# Patient Record
Sex: Female | Born: 1970 | Race: Black or African American | Hispanic: No | Marital: Single
Health system: Southern US, Community
[De-identification: ages and names within clinical notes are randomized; demographics above are authoritative.]

## PROBLEM LIST (undated history)

## (undated) DIAGNOSIS — N912 Amenorrhea, unspecified: Secondary | ICD-10-CM

## (undated) DIAGNOSIS — E669 Obesity, unspecified: Secondary | ICD-10-CM

## (undated) DIAGNOSIS — K219 Gastro-esophageal reflux disease without esophagitis: Secondary | ICD-10-CM

## (undated) DIAGNOSIS — Z1159 Encounter for screening for other viral diseases: Secondary | ICD-10-CM

## (undated) DIAGNOSIS — Z789 Other specified health status: Secondary | ICD-10-CM

## (undated) DIAGNOSIS — E039 Hypothyroidism, unspecified: Secondary | ICD-10-CM

## (undated) DIAGNOSIS — A599 Trichomoniasis, unspecified: Secondary | ICD-10-CM

## (undated) DIAGNOSIS — J309 Allergic rhinitis, unspecified: Secondary | ICD-10-CM

## (undated) DIAGNOSIS — R05 Cough: Secondary | ICD-10-CM

## (undated) DIAGNOSIS — R7301 Impaired fasting glucose: Secondary | ICD-10-CM

## (undated) DIAGNOSIS — R053 Chronic cough: Secondary | ICD-10-CM

## (undated) HISTORY — PX: TUBAL LIGATION: SHX77

## (undated) HISTORY — DX: Amenorrhea, unspecified: N91.2

## (undated) HISTORY — DX: Trichomoniasis, unspecified: A59.9

## (undated) HISTORY — DX: Other specified health status: Z78.9

## (undated) HISTORY — DX: Chronic cough: R05.3

## (undated) HISTORY — DX: Cough: R05

## (undated) HISTORY — DX: Allergic rhinitis, unspecified: J30.9

## (undated) HISTORY — DX: Gastro-esophageal reflux disease without esophagitis: K21.9

## (undated) HISTORY — PX: ENDOMETRIAL ABLATION: SHX621

## (undated) HISTORY — DX: Impaired fasting glucose: R73.01

## (undated) HISTORY — DX: Hypothyroidism, unspecified: E03.9

## (undated) HISTORY — DX: Obesity, unspecified: E66.9

## (undated) HISTORY — DX: Encounter for screening for other viral diseases: Z11.59

---

## 2011-03-29 ENCOUNTER — Encounter: Payer: Self-pay | Admitting: Family Medicine

## 2011-03-29 ENCOUNTER — Ambulatory Visit (INDEPENDENT_AMBULATORY_CARE_PROVIDER_SITE_OTHER): Payer: BC Managed Care – PPO | Admitting: Family Medicine

## 2011-03-29 VITALS — BP 110/70 | HR 97 | Ht 64.0 in | Wt 184.0 lb

## 2011-03-29 DIAGNOSIS — R05 Cough: Secondary | ICD-10-CM

## 2011-03-29 DIAGNOSIS — K219 Gastro-esophageal reflux disease without esophagitis: Secondary | ICD-10-CM

## 2011-03-29 MED ORDER — ALBUTEROL SULFATE HFA 108 (90 BASE) MCG/ACT IN AERS: 2.0000 | INHALATION_SPRAY | Freq: Four times a day (QID) | RESPIRATORY_TRACT | Status: DC | PRN

## 2011-03-29 NOTE — Progress Notes (Signed)
  Subjective:    Patient ID: Gail Bartlett, female    DOB: Apr 29, 1970, 41 y.o.   MRN: [id number]  HPI She is here for evaluation of a one year history of cough she has been seen in the past for this and apparently had a chest x-ray which was negative. She was placed on Nexium which did not help. No sneezing, wheezing,itchy watery eyes or runny nose. She can cough at any time. Sometimes foods do it. She does notices a little more at work. She also complains of acid the feeling but cannot relate this to coughing. She states that the Nexium even with double dose and did not help with this symptom.   Review of Systems     Objective:   Physical Exam alert and in no distress. Tympanic membranes and canals are normal. Throat is clear. Tonsils are normal. Neck is supple without adenopathy or thyromegaly. Cardiac exam shows a regular sinus rhythm without murmurs or gallops. Lungs are clear to auscultation.        Assessment & Plan:  Chronic cough, GERD  etiology unclear. I will initially treat her with Dexilant to see if this will help with her cough. If this does not help, I did call in a prescription for albuterol. She is to let me know how this works. I explained to her that cough can come from multiple causes and sometimes be very difficult to figure out.

## 2011-03-29 NOTE — Patient Instructions (Signed)
Take the Dexilant daily for the next 5 days and let me know how the medication works. If it does not work then pick up the prescription that I will call them for you

## 2011-04-06 DIAGNOSIS — A599 Trichomoniasis, unspecified: Secondary | ICD-10-CM

## 2011-04-06 HISTORY — DX: Trichomoniasis, unspecified: A59.9

## 2011-04-30 ENCOUNTER — Ambulatory Visit (INDEPENDENT_AMBULATORY_CARE_PROVIDER_SITE_OTHER): Payer: BC Managed Care – PPO | Admitting: Family Medicine

## 2011-04-30 ENCOUNTER — Encounter: Payer: Self-pay | Admitting: Family Medicine

## 2011-04-30 ENCOUNTER — Other Ambulatory Visit (HOSPITAL_COMMUNITY)
Admission: RE | Admit: 2011-04-30 | Discharge: 2011-04-30 | Disposition: A | Discharge status: Home / Self Care | Payer: BC Managed Care – PPO | Source: Ambulatory Visit | Attending: Family Medicine | Admitting: Family Medicine

## 2011-04-30 VITALS — BP 110/76 | HR 80 | Ht 64.25 in | Wt 183.0 lb

## 2011-04-30 DIAGNOSIS — R059 Cough, unspecified: Secondary | ICD-10-CM

## 2011-04-30 DIAGNOSIS — R05 Cough: Secondary | ICD-10-CM

## 2011-04-30 DIAGNOSIS — R5381 Other malaise: Secondary | ICD-10-CM

## 2011-04-30 DIAGNOSIS — E039 Hypothyroidism, unspecified: Secondary | ICD-10-CM

## 2011-04-30 DIAGNOSIS — R7301 Impaired fasting glucose: Secondary | ICD-10-CM

## 2011-04-30 DIAGNOSIS — Z13220 Encounter for screening for lipoid disorders: Secondary | ICD-10-CM

## 2011-04-30 DIAGNOSIS — R32 Unspecified urinary incontinence: Secondary | ICD-10-CM

## 2011-04-30 DIAGNOSIS — Z0000 Encounter for general adult medical examination without abnormal findings: Secondary | ICD-10-CM

## 2011-04-30 DIAGNOSIS — Z23 Encounter for immunization: Secondary | ICD-10-CM

## 2011-04-30 DIAGNOSIS — Z01419 Encounter for gynecological examination (general) (routine) without abnormal findings: Secondary | ICD-10-CM | POA: Insufficient documentation

## 2011-04-30 DIAGNOSIS — Z202 Contact with and (suspected) exposure to infections with a predominantly sexual mode of transmission: Secondary | ICD-10-CM

## 2011-04-30 DIAGNOSIS — Z113 Encounter for screening for infections with a predominantly sexual mode of transmission: Secondary | ICD-10-CM | POA: Insufficient documentation

## 2011-04-30 LAB — COMPREHENSIVE METABOLIC PANEL
ALT: 13 U/L (ref 0–35)
CO2: 25 mEq/L (ref 19–32)
Creat: 1.02 mg/dL (ref 0.50–1.10)
Total Bilirubin: 0.3 mg/dL (ref 0.3–1.2)

## 2011-04-30 LAB — POCT URINALYSIS DIPSTICK
Bilirubin, UA: NEGATIVE
Blood, UA: NEGATIVE
Glucose, UA: NEGATIVE
Ketones, UA: NEGATIVE
Spec Grav, UA: 1.01

## 2011-04-30 LAB — LIPID PANEL
HDL: 58 mg/dL (ref >39)
LDL Cholesterol: 72 mg/dL (ref 0–99)
Total CHOL/HDL Ratio: 2.4 Ratio
Triglycerides: 46 mg/dL (ref <150)
VLDL: 9 mg/dL (ref 0–40)

## 2011-04-30 LAB — HIV ANTIBODY (ROUTINE TESTING W REFLEX): HIV: NONREACTIVE

## 2011-04-30 LAB — CBC WITH DIFFERENTIAL/PLATELET
Basophils Absolute: 0 10*3/uL (ref 0.0–0.1)
Eosinophils Relative: 2 % (ref 0–5)
HCT: 39.2 % (ref 36.0–46.0)
Lymphocytes Relative: 20 % (ref 12–46)
Lymphs Abs: 2.2 10*3/uL (ref 0.7–4.0)
MCV: 84.1 fL (ref 78.0–100.0)
Monocytes Absolute: 0.4 10*3/uL (ref 0.1–1.0)
RDW: 14.7 % (ref 11.5–15.5)
WBC: 11.1 10*3/uL — ABNORMAL HIGH (ref 4.0–10.5)

## 2011-04-30 NOTE — Progress Notes (Signed)
Gail Bartlett is a 41 y.o. female who presents for a complete physical.  She has the following concerns:  F/u on cough.  Took Dexilant samples, which didn't seem to help much.  Seems to cough a lot at work (?related to scents?), never tried the inhaler that Dr. Susann Bartlett rx'd. She has a lot of throat clearing, but denies runny nose or sneezing.  Cough has overall gotten a whole lot better, but still periodically has spasms of coughing, feels like there is phlegm in her throat.  Hypothyroidism--ran out of her medications for 4 days, started back on meds again last week.  Felt very fatigued while out of meds, and hasn't gotten back to baseline yet.  Health Maintenance:  There is no immunization history on file for this patient. Doesn't recall last tetanus shot, doesn't think she needed it for her job.  Has gotten TB tests and Hepatitis B vaccines. Refuses flu shots Last Pap smear: 2 years ago; 1 abnormal pap with normal repeat, no treatment needed Last mammogram: 2 years ago Last colonoscopy: never Last DEXA: never Dentist: 3 years ago Ophtho: never Exercise: nothing outside of work  Past Medical History  Diagnosis Date  . Hypothyroidism age 43  . Impaired fasting glucose     when at 200 pounds  . GERD (gastroesophageal reflux disease)     Past Surgical History  Procedure Date  . Tubal ligation   . Endometrial ablation 2006 or 2007  . Cesarean section     History   Social History  . Marital Status: Single    Spouse Name: N/A    Number of Children: 2  . Years of Education: N/A   Occupational History  . CNA [employer]   Social History Main Topics  . Smoking status: Never Smoker   . Smokeless tobacco: Never Used  . Alcohol Use: No  . Drug Use: No  . Sexually Active: Not Currently   Other Topics Concern  . Not on file   Social History Narrative   Lives with son.  Daughter lives in [place], [place]  (has 1 grandson). No pets    Family History  Problem  Relation Age of Onset  . Hypertension Mother   . Anemia Mother     needed blood tranfusions  . Cancer Father     brain  . Schizophrenia Father     paranoid  . Gout Father   . Diabetes Brother   . ADD / ADHD Daughter   . ADD / ADHD Son   . Cancer Maternal Grandmother     lung cancer    Current outpatient prescriptions:levothyroxine (SYNTHROID) 100 MCG tablet, Take 100 mcg by mouth daily., Disp: , Rfl: ;  albuterol (PROVENTIL HFA;VENTOLIN HFA) 108 (90 BASE) MCG/ACT inhaler, Inhale 2 puffs into the lungs every 6 (six) hours as needed for wheezing., Disp: 1 Inhaler, Rfl: 0  No Known Allergies  ROS:  The patient denies anorexia, fever, weight changes, headaches,  vision changes, decreased hearing, ear pain, sore throat, breast concerns, chest pain, palpitations, dizziness, syncope, dyspnea on exertion, swelling, nausea, vomiting, diarrhea, constipation, abdominal pain, melena, hematochezia, indigestion/heartburn, hematuria, dysuria, vaginal discharge, odor or itch, genital lesions, joint pains, numbness, tingling, weakness, tremor, suspicious skin lesions, depression, anxiety, abnormal bleeding/bruising, or enlarged lymph nodes. +gassiness (possibly from lactose intolerance).  +headaches (frontal). Phlegm is clear when she is able to expectorate some.  Leakage of urine with coughing spells., otherwise no urinary symptoms, occasional knee pain. No vaginal bleeding since  ablation. Occasional sciatic nerve pain R leg  PHYSICAL EXAM: BP 110/76  Pulse 80  Ht 5' 4.25" (1.632 m)  Wt 183 lb (83.008 kg)  BMI 31.17 kg/m2  General Appearance:    Alert, cooperative, no distress, appears stated age.  Occasional cough.  Head:    Normocephalic, without obvious abnormality, atraumatic  Eyes:    PERRL, conjunctiva/corneas clear, EOM's intact, fundi    benign  Ears:    Normal TM's and external ear canals  Nose:   Nares normal, mucosa normal, no drainage or sinus tenderness. Nasal mucosa mildly edematous,  pale  Throat:   Lips, mucosa, and tongue normal; teeth and gums normal  Neck:   Supple, no lymphadenopathy;  thyroid:  no   enlargement/tenderness/nodules; no carotid   bruit or JVD  Back:    Spine nontender, no curvature, ROM normal, no CVA     tenderness  Lungs:     Clear to auscultation bilaterally without wheezes, rales or     ronchi; respirations unlabored  Chest Wall:    No tenderness or deformity   Heart:    Regular rate and rhythm, S1 and S2 normal, no murmur, rub   or gallop  Breast Exam:    No tenderness, masses, or nipple discharge or inversion.      No axillary lymphadenopathy.  Diffuse fibrocystic changes bilaterally, tender in UOQ  Bilaterally without discrete mass  Abdomen:     Soft, non-tender, nondistended, normoactive bowel sounds,    no masses, no hepatosplenomegaly  Genitalia:    Normal external genitalia without lesions.  BUS and vagina normal; cervix without lesions, or cervical motion tenderness. No abnormal vaginal discharge.  Uterus and adnexa not enlarged, nontender, no masses.  Pap performed  Rectal:    Normal tone, no masses or tenderness; guaiac negative stool  Extremities:   No clubbing, cyanosis or edema  Pulses:   2+ and symmetric all extremities  Skin:   Skin color, texture, turgor normal, no rashes or lesions  Lymph nodes:   Cervical, supraclavicular, and axillary nodes normal  Neurologic:   CNII-XII intact, normal strength, sensation and gait; reflexes 2+ and symmetric throughout          Psych:   Normal mood, affect, hygiene and grooming.      ASSESSMENT/PLAN: 1. Routine general medical examination at a health care facility  POCT Urinalysis Dipstick, Visual acuity screening, Cytology - PAP, HIV Antibody  2. Unspecified hypothyroidism  TSH  3. Other malaise and fatigue  Comprehensive metabolic panel, CBC with Differential, Vitamin D 25 hydroxy, TSH  4. Exposure to STD  Cytology - PAP, HIV Antibody  5. Impaired fasting glucose  Hemoglobin A1c  6.  Screening for lipoid disorders  Lipid panel  7. Need for Tdap vaccination  Tdap vaccine greater than or equal to 7yo IM  8. Incontinence  Urine Culture  9. Cough     Cough--not improved with treatment for reflux.  Likely has allergy component too--recommended trial of OTC Claritin or Zyrtec. Trial of inhaler as needed at work.  Possible lactose intolerance as cause for gassiness.  Reviewed lactose-free diet.  Hypothyroidism--missed only 4 doses, still fatigued and back on meds x 5 days.  If TSH borderline, plan to continue current dose and repeat in 3 months.  If very high, then adjust dose and recheck in 6 weeks.  Discussed monthly self breast exams and yearly mammograms after the age of 13; at least 30 minutes of aerobic activity at least 5 days/week;  proper sunscreen use reviewed; healthy diet, including goals of calcium and vitamin D intake and alcohol recommendations (less than or equal to 1 drink/day) reviewed; regular seatbelt use; changing batteries in smoke detectors.  Immunization recommendations discussed.  Colonoscopy recommendations reviewed  TdaP today. Yearly flu shots encouraged.  Dentist recommended every 6 months. Schedule routine eye exam (if all okay, every 3-5 years)

## 2011-04-30 NOTE — Patient Instructions (Signed)
HEALTH MAINTENANCE RECOMMENDATIONS:  It is recommended that you get at least 30 minutes of aerobic exercise at least 5 days/week (for weight loss, you may need as much as 60-90 minutes). This can be any activity that gets your heart rate up. This can be divided in 10-15 minute intervals if needed, but try and build up your endurance at least once a week.  Weight bearing exercise is also recommended twice weekly.  Eat a healthy diet with lots of vegetables, fruits and fiber.  "Colorful" foods have a lot of vitamins (ie green vegetables, tomatoes, red peppers, etc).  Limit sweet tea, regular sodas and alcoholic beverages, all of which has a lot of calories and sugar.  Up to 1 alcoholic drink daily may be beneficial for women (unless trying to lose weight, watch sugars).  Drink a lot of water.  Calcium recommendations are 1200-1500 mg daily (1500 mg for postmenopausal women or women without ovaries), and vitamin D 1000 IU daily.  This should be obtained from diet and/or supplements (vitamins), and calcium should not be taken all at once, but in divided doses.  Monthly self breast exams and yearly mammograms for women over the age of 83 is recommended.  Sunscreen of at least SPF 30 should be used on all sun-exposed parts of the skin when outside between the hours of 10 am and 4 pm (not just when at beach or pool, but even with exercise, golf, tennis, and yard work!)  Use a sunscreen that says "broad spectrum" so it covers both UVA and UVB rays, and make sure to reapply every 1-2 hours.  Remember to change the batteries in your smoke detectors when changing your clock times in the spring and fall.  Try taking Claritin or Zyrtec daily, to see if this helps with your cough.  If coughing spasm at work, try using the inhaler. Use your seat belt every time you are in a car, and please drive safely and not be distracted with cell phones and texting while driving.  Schedule routine dental visits (cleanings  every 6 months) Schedule routine eye exam (every 3-5 years)

## 2011-05-01 DIAGNOSIS — E039 Hypothyroidism, unspecified: Secondary | ICD-10-CM | POA: Insufficient documentation

## 2011-05-01 LAB — HEMOGLOBIN A1C: Mean Plasma Glucose: 126 mg/dL — ABNORMAL HIGH (ref <117)

## 2011-05-02 ENCOUNTER — Encounter: Payer: Self-pay | Admitting: Family Medicine

## 2011-05-02 ENCOUNTER — Other Ambulatory Visit: Payer: Self-pay | Admitting: *Deleted

## 2011-05-02 MED ORDER — ERGOCALCIFEROL 1.25 MG (50000 UT) PO CAPS: 50000.0000 [IU] | ORAL_CAPSULE | ORAL | Status: DC

## 2011-05-03 ENCOUNTER — Telehealth: Payer: Self-pay | Admitting: *Deleted

## 2011-05-03 ENCOUNTER — Other Ambulatory Visit: Payer: Self-pay | Admitting: *Deleted

## 2011-05-03 DIAGNOSIS — A599 Trichomoniasis, unspecified: Secondary | ICD-10-CM

## 2011-05-03 MED ORDER — METRONIDAZOLE 500 MG PO TABS: 2000.0000 mg | ORAL_TABLET | Freq: Once | ORAL | Status: DC

## 2011-05-03 NOTE — Telephone Encounter (Signed)
Error

## 2011-05-04 LAB — URINE CULTURE

## 2011-05-07 ENCOUNTER — Other Ambulatory Visit: Payer: Self-pay | Admitting: *Deleted

## 2011-05-07 DIAGNOSIS — N390 Urinary tract infection, site not specified: Secondary | ICD-10-CM

## 2011-05-07 MED ORDER — SULFAMETHOXAZOLE-TMP DS 800-160 MG PO TABS: 1.0000 | ORAL_TABLET | Freq: Two times a day (BID) | ORAL | Status: DC

## 2011-06-25 ENCOUNTER — Other Ambulatory Visit: Payer: Self-pay | Admitting: Family Medicine

## 2011-06-25 DIAGNOSIS — E039 Hypothyroidism, unspecified: Secondary | ICD-10-CM

## 2011-06-25 MED ORDER — SYNTHROID 100 MCG PO TABS: 100.0000 ug | ORAL_TABLET | Freq: Every day | ORAL | Status: DC

## 2011-06-25 NOTE — Telephone Encounter (Signed)
done

## 2011-06-25 NOTE — Telephone Encounter (Signed)
PT NEEDS BRAND NAME SYNTHROID, GET HA'S WITH GENERICS

## 2011-09-05 ENCOUNTER — Encounter: Payer: Self-pay | Admitting: Family Medicine

## 2011-09-05 ENCOUNTER — Ambulatory Visit (INDEPENDENT_AMBULATORY_CARE_PROVIDER_SITE_OTHER): Payer: Medicaid Other | Admitting: Family Medicine

## 2011-09-05 ENCOUNTER — Ambulatory Visit
Admission: RE | Admit: 2011-09-05 | Discharge: 2011-09-05 | Disposition: A | Discharge status: Home / Self Care | Payer: Medicaid Other | Source: Ambulatory Visit | Attending: Family Medicine | Admitting: Family Medicine

## 2011-09-05 VITALS — BP 112/72 | HR 76 | Temp 97.6°F | Ht 64.25 in | Wt 185.0 lb

## 2011-09-05 DIAGNOSIS — R05 Cough: Secondary | ICD-10-CM | POA: Insufficient documentation

## 2011-09-05 DIAGNOSIS — R059 Cough, unspecified: Secondary | ICD-10-CM

## 2011-09-05 DIAGNOSIS — J309 Allergic rhinitis, unspecified: Secondary | ICD-10-CM

## 2011-09-05 MED ORDER — FLUTICASONE PROPIONATE 50 MCG/ACT NA SUSP: 2.0000 | Freq: Every day | NASAL | Status: DC

## 2011-09-05 NOTE — Patient Instructions (Signed)
Cough--x 1 year, worse recently.  Likely related to allergies given exam.  Check x-ray of your chest to make sure there isn't something else going on that we have missed.  Try guaifenesin (with dextromethorphan as needed for cough suppressant--Mucinex DM or Robitussin DM).  Recommend continued use of antihistamines (zyrtec or claritin).  Trial of Flonase--2 gentle sniffs into each nostril once daily.  We are referring you to allergist for testing to see if there are allergens which may be avoidable (ie peanuts, etc.)

## 2011-09-05 NOTE — Progress Notes (Signed)
Chief Complaint  Patient presents with  . Gastrophageal Reflux    follow up.  . Cough    her cough is getting worse and she would like to discuss.  . Vitamin D def    pt finished rx but did not get any OTC vitamin D.   HPI: Cough--cough has gotten worse again in the last couple of months.  Has episodes of cough that are so severe that she has to pull over while driving to throw up.  Denies nausea, indigestion, heartburn.  Sometimes cough is dry, other times coughs up clear phlegm, but very thick.  +throat clearing and postnasal drainage.  Denies runny nose, sinus or head congestion.  Tried Zyrtec for about a month, also tried claritin, but it doesn't seem to help.  She used some Mucinex which seemed to help, but it is very expensive.  Sometimes cough is triggered by foods, but not any particular food.  Perhaps peanuts contributes, as she is worse after eating Payday.  Also worse when she has the windows down in her car, or if really warm.  She started coughing about a year ago.  Never got benefit from nexium, so stopped it. Hasn't had an x-ray as part of her cough workup.  Hypothyroid--compliant with meds.  A little more tired than usual.  Hasn't been working.  Gained 2 pounds since last visit. Denies hair loss, bowel changes  Past Medical History  Diagnosis Date  . Hypothyroidism age 41  . Impaired fasting glucose     when at 200 pounds  . GERD (gastroesophageal reflux disease)   . Trichomonas vaginalis infection 04/2011   Past Surgical History  Procedure Date  . Tubal ligation   . Endometrial ablation 2006 or 2007  . Cesarean section    History   Social History  . Marital Status: Single    Spouse Name: N/A    Number of Children: 2  . Years of Education: N/A   Occupational History  . CNA [employer]   Social History Main Topics  . Smoking status: Never Smoker   . Smokeless tobacco: Never Used  . Alcohol Use: No  . Drug Use: No  . Sexually Active: Not  Currently   Other Topics Concern  . Not on file   Social History Narrative   Lives with son.  Daughter lives in [place], [place]  (has 1 grandson). No pets.  Currently not working.   Current Outpatient Prescriptions on File Prior to Visit  Medication Sig Dispense Refill  . SYNTHROID 100 MCG tablet Take 1 tablet (100 mcg total) by mouth daily.  90 tablet  2  . albuterol (PROVENTIL HFA;VENTOLIN HFA) 108 (90 BASE) MCG/ACT inhaler Inhale 2 puffs into the lungs every 6 (six) hours as needed for wheezing.  1 Inhaler  0  . fluticasone (FLONASE) 50 MCG/ACT nasal spray Place 2 sprays into the nose daily.  16 g  6   No Known Allergies  ROS: Denies fever, sinus pain, ear pain, abdominal pain, diarrhea, skin rash or other problems except as noted in HPI  PHYSICAL EXAM: BP 112/72  Pulse 76  Temp 97.6 F (36.4 C) (Oral)  Ht 5' 4.25" (1.632 m)  Wt 185 lb (83.915 kg)  BMI 31.51 kg/m2 Well developed, pleasant female in no distress.  Occasional dry cough HEENT:  PERRL, EOMI, conjunctiva clear. OP clear. Nasal mucosa with moderate edema, clear mucus.  Sinuses nontender Neck: no lymphadenopathy or mass Heart: regular rate and rhythm without  murmur Lungs: clear bilaterally with good air movement.  No wheeze with forced expiration, or any increase in cough Extremities: no edema Psych: normal mood, affect, hygiene and grooming Skin: no rash Abdomen: soft, nontender, no organomegaly or mass  ASSESSMENT/PLAN:  1. Allergic rhinitis, cause unspecified  Ambulatory referral to Allergy, fluticasone (FLONASE) 50 MCG/ACT nasal spray  2. Cough  Ambulatory referral to Allergy, DG Chest 2 View   Cough--x 1 year, worse recently.  Likely related to allergies given exam.  Check CXR.  Recommended guaifenesin (with dextromethorphan as needed for cough suppressant).  Recommend continued use of antihistamines.  Trial of Flonase.  Refer to allergist for testing to see if there are allergies which may be avoidable (ie  peanuts, etc.)

## 2011-09-19 ENCOUNTER — Ambulatory Visit (INDEPENDENT_AMBULATORY_CARE_PROVIDER_SITE_OTHER): Payer: Medicaid Other | Admitting: Family Medicine

## 2011-09-19 ENCOUNTER — Encounter: Payer: Self-pay | Admitting: Family Medicine

## 2011-09-19 VITALS — Wt 191.0 lb

## 2011-09-19 DIAGNOSIS — Z23 Encounter for immunization: Secondary | ICD-10-CM

## 2011-09-19 DIAGNOSIS — Z7189 Other specified counseling: Secondary | ICD-10-CM

## 2011-09-19 MED ORDER — MEASLES, MUMPS & RUBELLA VAC ~~LOC~~ INJ: 0.5000 mL | INJECTION | Freq: Once | SUBCUTANEOUS | Status: AC

## 2011-09-19 NOTE — Progress Notes (Signed)
  Subjective:    Patient ID: Gail Bartlett, female    DOB: January 30, 1971, 41 y.o.   MRN: [id number]  HPI She is here for consult concerning immunization update. She is starting college and does need her immunizations. She does not have her record from [place]. Apparently she did have a TDaP in March.  Review of Systems     Objective:   Physical Exam Alert and in no distress.       Assessment & Plan:   1. Immunization due  measles, mumps and rubella vaccine (MMR) injection 0.5 mL  2. Immunization counseling     we discussed immunizations with her in detail. She needs this for school and unfortunately did wait until the very end. Recommend she go to the health department to get the tetanus shot.

## 2011-10-22 ENCOUNTER — Other Ambulatory Visit: Payer: Medicaid Other

## 2011-10-22 ENCOUNTER — Encounter: Payer: Self-pay | Admitting: Family Medicine

## 2011-10-22 ENCOUNTER — Ambulatory Visit (INDEPENDENT_AMBULATORY_CARE_PROVIDER_SITE_OTHER): Payer: Medicaid Other | Admitting: Family Medicine

## 2011-10-22 VITALS — BP 100/60 | HR 100 | Temp 97.9°F | Resp 16 | Wt 190.0 lb

## 2011-10-22 DIAGNOSIS — Z23 Encounter for immunization: Secondary | ICD-10-CM

## 2011-10-22 DIAGNOSIS — R05 Cough: Secondary | ICD-10-CM

## 2011-10-22 DIAGNOSIS — R053 Chronic cough: Secondary | ICD-10-CM

## 2011-10-22 DIAGNOSIS — J309 Allergic rhinitis, unspecified: Secondary | ICD-10-CM

## 2011-10-22 DIAGNOSIS — K219 Gastro-esophageal reflux disease without esophagitis: Secondary | ICD-10-CM

## 2011-10-22 DIAGNOSIS — R059 Cough, unspecified: Secondary | ICD-10-CM

## 2011-10-22 MED ORDER — BENZONATATE 100 MG PO CAPS: 100.0000 mg | ORAL_CAPSULE | Freq: Three times a day (TID) | ORAL | Status: DC | PRN

## 2011-10-22 MED ORDER — DEXLANSOPRAZOLE 60 MG PO CPDR: 60.0000 mg | DELAYED_RELEASE_CAPSULE | Freq: Every day | ORAL | Status: DC

## 2011-10-22 MED ORDER — ALBUTEROL SULFATE HFA 108 (90 BASE) MCG/ACT IN AERS: 2.0000 | INHALATION_SPRAY | Freq: Four times a day (QID) | RESPIRATORY_TRACT | Status: DC | PRN

## 2011-10-22 NOTE — Patient Instructions (Addendum)
Use inhaler when having dry hacky cough (ie exposure to dust).  Refill sent today.  Start dexilant daily (samples given) to treat any  Underlying reflux that may be contributing to cough.  Doesn't sound as though Singulair has made much difference in the cough.  If the albuterol is helpful, then we can consider inhaled steroids--but for now will leave that up to pulmonary docs to decide.    Allergies--based on appearance of nasal mucosa, flonase isn't working well due to improper use.  Ensure you are just sniffing gently, so it doesn't drip back out--not sniffing so hard that you feel is draining down back of throat.  Use 2 sprays each nostril every day.  Trial of tessalon perles to be used as needed for cough (ie take before church)--not to be used chronically or daily.  We are referring you to [facility]  ADD--consider seeing Dr. Marisue Brooklyn at [facility] (on [address]) if you continue to have problems with your memory and concentration.  Try using tools we discussed first (keeping lists, etc.)

## 2011-10-22 NOTE — Progress Notes (Signed)
Chief Complaint  Patient presents with  . consult visit about her on going cough   HPI: Patient presents for follow-up on chronic cough, requesting referral to specialist.   Feels like there is a lot of mucus in the back of the throat, clear when she coughs it up.  Worse in the morning, but occurs all day long. Often times cough is dry. +throat clearing and postnasal drainage. Denies runny nose, sinus or head congestion. Tried Zyrtec for about a month, also tried claritin, but it doesn't seem to help. She used some Mucinex which seemed to help, but it is very expensive. Sometimes cough is triggered by foods, but not any particular food. Perhaps peanuts contributes, as she is worse after eating Payday. Also worse when she has the windows down in her car, or if really warm. Hacky cough when exposed to dust. She started coughing about a year ago. Never got benefit from nexium, so stopped it.  Had CXR done in 09/05/11 which was normal, and she was restarted on Flonase, but reports it never helped. She does reports sniffing it very hard, and feeling it go down her throat and tasting bad.  She saw allergist since her last visit, and told she was allergic to cats, dogs, dust mites and something else (she can't remember).  She reports that Dr. [place] Callas was supposed to have prescribed some sort of inhaler, but she never got it (?not at pharmacy)--she had tried albuterol in past (and reported no benefit). Review of 8/13 OV with Dr. [place] Callas shows that she was prescribed ProAir inhaler and she was also told to continue Flonase, and singulair was started.  She doesn't notice any significant improvement in her coughing since starting Singulair.  He suggested trying to manage reflux better, and to f/u in 6 months.  She sometimes notes reflux, but not daily, maybe 3-4x/week.  Tried Nexium in the past, but cough didn't improve.  She would like referral for further evaluation of cough, which has now been ongoing for almost  2 years.  She is embarrassed by her cough, others think she is ill, contagious, very aggravating to her.  She is also complaining of some memory concerns.  Was in special ed classes growing up.  She reports having been tested for ADD and was prescribed Adderall but she never took it.  She is worried about dementia (given the population of people she works with, and what she has seen).  Past Medical History  Diagnosis Date  . Hypothyroidism age 41  . Impaired fasting glucose     when at 200 pounds  . GERD (gastroesophageal reflux disease)   . Trichomonas vaginalis infection 04/2011  . Allergy    Past Surgical History  Procedure Date  . Tubal ligation   . Endometrial ablation 2006 or 2007  . Cesarean section    History   Social History  . Marital Status: Single    Spouse Name: N/A    Number of Children: 2  . 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 Smoking status: Never Smoker   . Smokeless tobacco: Never Used  . Alcohol Use: No  . Drug Use: No  . Sexually Active: Not Currently   Other Topics Concern  . Not on file   Social History Narrative   Lives with son.  Daughter lives in [place], [place]  (has 1 grandson). No pets.  Currently not working.  Current Outpatient Prescriptions on File Prior to Visit  Medication Sig Dispense Refill  . albuterol (PROVENTIL HFA;VENTOLIN HFA) 108 (90 BASE) MCG/ACT inhaler Inhale 2 puffs into the lungs every 6 (six) hours as needed for wheezing.  1 Inhaler  0  . SYNTHROID 100 MCG tablet Take 1 tablet (100 mcg total) by mouth daily.  90 tablet  2  . fluticasone (FLONASE) 50 MCG/ACT nasal spray Place 2 sprays into the nose daily.  16 g  6   Current Facility-Administered Medications on File Prior to Visit  Medication Dose Route Frequency Provider Last Rate Last Dose  . measles, mumps and rubella vaccine (MMR) injection 0.5 mL  0.5 mL Subcutaneous Once Ronnald Nian, MD        No Known Allergies  ROS:  Denies fevers, sinus headaches, sore throat, chest pain, shortness of breath, nausea, vomiting, bowel changes, skin rash.  Denies depression.  +concentration and memory difficulties  PHYSICAL EXAM: BP 100/60  Pulse 100  Temp 97.9 F (36.6 C) (Oral)  Resp 16  Wt 190 lb (86.183 kg) Well developed, pleasant female in no distress.  Occasional throat clearing in office, no cough.  Doesn't sound congested. HEENT:  PERRL, EOMI, conjunctiva clear.  TM's and EAC's normal. Nasal mucosa is moderately edematous, no purulence. Sinuses nontender.  OP clear. Heart: regular rate and rhythm without murmur Lungs: clear bilaterally, good air movement. No wheeze with forced expiration, nor any cough. Abdomen: soft, nontender, no organomegaly or mass Extremities: no edema Skin: no rash Psych: normal mood, affect, hygiene and grooming.  No hyperactivity. Normal eye contact and speech  ASSESSMENT/PLAN: 1. Cough  albuterol (PROVENTIL HFA;VENTOLIN HFA) 108 (90 BASE) MCG/ACT inhaler, benzonatate (TESSALON PERLES) 100 MG capsule  2. Allergic rhinitis, cause unspecified    3. Chronic cough  Ambulatory referral to Pulmonology  4. GERD (gastroesophageal reflux disease)  dexlansoprazole (DEXILANT) 60 MG capsule  5. Need for MMR vaccine  MMR vaccine subcutaneous   Allergies--based on appearance of nasal mucosa, flonase likely isn't working well to adequately treat allergies due to improper use.  Educated regarding proper use.  Use inhaler when having dry hacky cough (ie exposure to dust).  Refill sent today.  Start dexilant daily (samples given) to treat any underlying reflux that may be contributing to cough.  Doesn't sound as though Singulair has made much difference in the cough.  If the albuterol is helpful, then we can consider inhaled steroids--but for now will leave that up to pulmonary docs to decide.    Trial of tessalon perles to be used as needed for cough (ie take before  church)--not to be used chronically or daily.  H/o ADD--most likely contributing to memory issues, rather than any dementia.  Reassured.  Consider seeing Dr[facility] El[facility] at [facility] if not improving with tools we discussed.  Declines flu shot today MMR #2 today given (needed for school, see prev visit).

## 2011-10-23 DIAGNOSIS — K219 Gastro-esophageal reflux disease without esophagitis: Secondary | ICD-10-CM | POA: Insufficient documentation

## 2011-11-15 ENCOUNTER — Encounter: Payer: Self-pay | Admitting: Internal Medicine

## 2011-11-15 ENCOUNTER — Ambulatory Visit (INDEPENDENT_AMBULATORY_CARE_PROVIDER_SITE_OTHER): Payer: Medicaid Other | Admitting: Internal Medicine

## 2011-11-15 VITALS — BP 108/70 | HR 93 | Temp 98.9°F | Ht 65.0 in | Wt 190.2 lb

## 2011-11-15 DIAGNOSIS — R05 Cough: Secondary | ICD-10-CM

## 2011-11-15 NOTE — Progress Notes (Signed)
Subjective:    Patient ID: Gail Bartlett, female    DOB: [birth year], 41 y.o.   MRN: [id number]  HPI PCP is KNAPP,EVE A, MD  Referred for chronic cough. Insidious onset x 18 months. Progressive. Quality is dry. Severe in intensity. Embarasses her socially. Day time worse than night. Associaed gag +, tickle in throat  + and constant clearing of throat +. In addition  There are some vomitting episodes. No specific aggravating factors identified other than fact mdi ? advair made cough worse. Relieved only partially byt halls lozenges and hot tea. RSI Cough score is 28 and c/w LPR cough. Kouffman cough differentiator score is 1.5 for GERD and 2.5 for airway/neurogenic cough   Allergy hx  -Reportedly seen Dr [place] Callas. Found to be allergic to dust, cats, dogs, roaches.  Pulmonary hx  - she suspects spirometry at Dr [place] Callas office was normal recently. Exhaled NO - is 7 today at our office and is LOW PROB for asthma. She is not taking inahlers at the moment  BP hx  - does not have hypertension. Not on aCE inhibitor  Sinus hx  - denies any problems  GERD hx  - + for few years. Nexium no help x 1 year. Dexilant x 1 month of no help.   - Diet reviewed - though she does not drink coffee or soda or eat chocolae she does have hot spices    Dr Gretta Cool Reflux Symptom Index (> 13-15 suggestive of LPR cough) 0 -> 5  =  none ->severe problem  Hoarseness of problem with voice 1  Clearing  Of Throat 5  Excess throat mucus or feeling of post nasal drip 5  Difficulty swallowing food, liquid or tablets 0  Cough after eating or lying down 4  Breathing difficulties or choking episodes 5  Troublesome or annoying cough 5  Sensation of something sticking in throat or lump in throat 0  Heartburn, chest pain, indigestion, or stomach acid coming up 3  TOTAL 28      Kouffman Reflux v Neurogenic Cough Differentiator Reflux Comments  Do you awaken from a sound sleep coughing violently?                             With trouble breathing? Yes   Do you have choking episodes when you cannot  Get enough air, gasping for air ?                 Do you usually cough when you lie down into  The bed, or when you just lie down to rest ?                             Do you usually cough after meals or eating?         sometimw   Do you cough when (or after) you bend over?       GERD SCORE  1.5   Kouffman Reflux v Neurogenic Cough Differentiator Neurogenic   Do you more-or-less cough all day long? y   Does change of temperature make you cough? sometimes   Does laughing or chuckling cause you to cough?    Do fumes (perfume, automobile fumes, burned  Toast, etc.,) cause you to cough ?      t   Does speaking, singing, or talking on the phone cause you to cough   ?  Neurogenic/Airway score 2.5       Past Medical History  Diagnosis Date  . Hypothyroidism age 67  . Impaired fasting glucose     when at 200 pounds  . GERD (gastroesophageal reflux disease)   . Trichomonas vaginalis infection 04/2011  . Allergy      Family History  Problem Relation Age of Onset  . Hypertension Mother   . Anemia Mother     needed blood tranfusions  . Cancer Father     brain  . Schizophrenia Father     paranoid  . Gout Father   . Diabetes Brother   . ADD / ADHD Daughter   . ADD / ADHD Son   . Cancer Maternal Grandmother     lung cancer     History   Social History  . Marital Status: Single    Spouse Name: N/A    Number of Children: 2  . Years of Education: N/A   Occupational History  . CNA [employer]   Social History Main Topics  . Smoking status: Never Smoker   . Smokeless tobacco: Never Used  . Alcohol Use: No  . Drug Use: No  . Sexually Active: Not Currently   Other Topics Concern  . Not on file   Social History Narrative   Lives with son.  Daughter lives in [place], [place]  (has 1 grandson). No pets.  Currently not working.     No Known Allergies   Outpatient  Prescriptions Prior to Visit  Medication Sig Dispense Refill  . benzonatate (TESSALON PERLES) 100 MG capsule Take 1 capsule (100 mg total) by mouth 3 (three) times daily as needed for cough.  30 capsule  0  . dexlansoprazole (DEXILANT) 60 MG capsule Take 1 capsule (60 mg total) by mouth daily.  15 capsule  0  . fluticasone (FLONASE) 50 MCG/ACT nasal spray Place 2 sprays into the nose daily.  16 g  6  . montelukast (SINGULAIR) 10 MG tablet Take 10 mg by mouth at bedtime.      Marland Kitchen SYNTHROID 100 MCG tablet Take 1 tablet (100 mcg total) by mouth daily.  90 tablet  2  . albuterol (PROVENTIL HFA;VENTOLIN HFA) 108 (90 BASE) MCG/ACT inhaler Inhale 2 puffs into the lungs every 6 (six) hours as needed for wheezing.  1 Inhaler  0   Facility-Administered Medications Prior to Visit  Medication Dose Route Frequency Provider Last Rate Last Dose  . measles, mumps and rubella vaccine (MMR) injection 0.5 mL  0.5 mL Subcutaneous Once Ronnald Nian, MD          Review of Systems  Constitutional: Negative.   HENT: Negative.   Eyes: Negative.   Respiratory: Positive for cough and choking. Negative for apnea, chest tightness, shortness of breath, wheezing and stridor.   Cardiovascular: Negative.   Gastrointestinal: Positive for vomiting. Negative for nausea, abdominal pain, diarrhea, constipation, blood in stool, abdominal distention, anal bleeding and rectal pain.  Genitourinary: Negative.   Musculoskeletal: Negative.   Skin: Negative.   Neurological: Negative.   Hematological: Negative.   Psychiatric/Behavioral: Negative.        Objective:   Physical Exam  Vitals reviewed. Constitutional: She is oriented to person, place, and time. She appears well-developed and well-nourished. No distress.  HENT:  Head: Normocephalic and atraumatic.  Right Ear: External ear normal.  Left Ear: External ear normal.  Mouth/Throat: Oropharynx is clear and moist. No oropharyngeal exudate.  Eyes: Conjunctivae normal  and EOM  are normal. Pupils are equal, round, and reactive to light. Right eye exhibits no discharge. Left eye exhibits no discharge. No scleral icterus.  Neck: Normal range of motion. Neck supple. No JVD present. No tracheal deviation present. No thyromegaly present.  Cardiovascular: Normal rate, regular rhythm, normal heart sounds and intact distal pulses.  Exam reveals no gallop and no friction rub.   No murmur heard. Pulmonary/Chest: Effort normal and breath sounds normal. No respiratory distress. She has no wheezes. She has no rales. She exhibits no tenderness.  Abdominal: Soft. Bowel sounds are normal. She exhibits no distension and no mass. There is no tenderness. There is no rebound and no guarding.  Musculoskeletal: Normal range of motion. She exhibits no edema and no tenderness.  Lymphadenopathy:    She has no cervical adenopathy.  Neurological: She is alert and oriented to person, place, and time. She has normal reflexes. No cranial nerve deficit. She exhibits normal muscle tone. Coordination normal.  Skin: Skin is warm and dry. No rash noted. She is not diaphoretic. No erythema. No pallor.  Psychiatric: She has a normal mood and affect. Her behavior is normal. Judgment and thought content normal.          Assessment & Plan:

## 2011-11-15 NOTE — Patient Instructions (Addendum)
Cough is from possible sinus drainage, acid reflux,  All of this is working together to cause cyclical cough/LPR cough or IRRITABLE LARYNX  #Sinus drainage  - continue  nasal steroid generic fluticasone inhaler 2 squirts each nostril daily as advised (nurse will do script)  #Possible Acid Reflux  - continue nexium or dexilant 1 capsule daily on empty stomach (nurse will send script)   - take diet sheet from [place] - avoid colas, spices, cheeses, spirits, red meats, beer, chocolates, fried foods etc.,   - sleep with head end of bed elevated  - eat small frequent meals  - do not go to bed for 3 hours after last meal  #Cyclical cough  - please choose 2-3 days and observe complete voice rest - no talking or whispering  - at all times there  there is urge to cough, drink water or swallow or sip on throat lozenge - Take gabapentin 300mg  once daily x 3 days, then 300mg  twice daily x 3 days, then 300mg  three times daily to continue. If this makes you too sleepy or drowsy call us and we will cut your medication dosing down   #Followup - I will see you in 4-6 weeks.  - any problems call or come sooner - cough score at fu

## 2011-11-18 NOTE — Assessment & Plan Note (Signed)
Cough is from possible sinus drainage, acid reflux,  All of this is working together to cause cyclical cough/LPR cough or IRRITABLE LARYNX  #Sinus drainage  - continue  nasal steroid generic fluticasone inhaler 2 squirts each nostril daily as advised (nurse will do script)  #Possible Acid Reflux  - continue nexium or dexilant 1 capsule daily on empty stomach (nurse will send script)   - take diet sheet from [place] - avoid colas, spices, cheeses, spirits, red meats, beer, chocolates, fried foods etc.,   - sleep with head end of bed elevated  - eat small frequent meals  - do not go to bed for 3 hours after last meal  #Cyclical cough  - please choose 2-3 days and observe complete voice rest - no talking or whispering  - at all times there  there is urge to cough, drink water or swallow or sip on throat lozenge - Take gabapentin 300mg  once daily x 3 days, then 300mg  twice daily x 3 days, then 300mg  three times daily to continue. If this makes you too sleepy or drowsy call us and we will cut your medication dosing down   #Followup - I will see you in 4-6 weeks.  - any problems call or come sooner

## 2011-11-26 ENCOUNTER — Telehealth: Payer: Self-pay | Admitting: Internal Medicine

## 2011-11-26 DIAGNOSIS — K219 Gastro-esophageal reflux disease without esophagitis: Secondary | ICD-10-CM

## 2011-11-26 MED ORDER — GABAPENTIN 300 MG PO CAPS: ORAL_CAPSULE | ORAL | Status: DC

## 2011-11-26 MED ORDER — DEXLANSOPRAZOLE 60 MG PO CPDR: 60.0000 mg | DELAYED_RELEASE_CAPSULE | Freq: Every day | ORAL | Status: DC

## 2011-11-26 NOTE — Telephone Encounter (Signed)
Rxs were sent to pharm  LMOM for the pt to be made aware   

## 2011-11-27 ENCOUNTER — Encounter: Payer: Self-pay | Admitting: Internal Medicine

## 2011-12-07 ENCOUNTER — Telehealth: Payer: Self-pay | Admitting: *Deleted

## 2011-12-07 NOTE — Telephone Encounter (Signed)
I would prefer if she does OTC zegerid 20mg  daily on empty stomach. If that is expensive then plain prilosec 40mg  daily either OTC or through script

## 2011-12-07 NOTE — Telephone Encounter (Signed)
I spoke with the pt and she has not been taking either medication, nexium or dexilant. Insurance will not cover dexilant. Is it ok to send rx for prilosec? And if so what strength and directions. Thanks. Carron Curie, CMA

## 2011-12-07 NOTE — Telephone Encounter (Signed)
I received a PA for dexilant, but pt instruct from last OV states to take either nexium or dexilant. I lmtcb to see what med the pt is taking and to see if she has tried omeprazole. Carron Curie, CMA

## 2011-12-10 NOTE — Telephone Encounter (Signed)
I spoke with the pt and she is going to check on the cost of the medications OTC and will call me back to let me know if she wants an Rx sent in . Darrell Jewel

## 2011-12-11 ENCOUNTER — Telehealth: Payer: Self-pay | Admitting: Internal Medicine

## 2011-12-11 NOTE — Telephone Encounter (Signed)
Health choice is a form of medcaid.

## 2011-12-11 NOTE — Telephone Encounter (Signed)
I don't know what Health Choice is--but if it is an insurance that we accept, then I'm happy to accept him. (if not, let me know what it is and I'll check with Lafonda Mosses)

## 2011-12-11 NOTE — Telephone Encounter (Signed)
Pt wants to know if you will take her son on to be his primary doctor. Her son is 41 years old and has aged out of pediatric care and he has health choice. Her son's name is Gail Bartlett DOB 05/18/94. Please call mom and let her know either yes or no

## 2011-12-12 NOTE — Telephone Encounter (Signed)
Pt notified and pt son has an appt in January

## 2011-12-12 NOTE — Telephone Encounter (Signed)
Ok to accept.  Please advise mother

## 2011-12-14 ENCOUNTER — Ambulatory Visit: Payer: Medicaid Other | Admitting: Internal Medicine

## 2012-01-18 ENCOUNTER — Encounter: Payer: Self-pay | Admitting: Internal Medicine

## 2012-01-18 ENCOUNTER — Ambulatory Visit (INDEPENDENT_AMBULATORY_CARE_PROVIDER_SITE_OTHER): Payer: Medicaid Other | Admitting: Internal Medicine

## 2012-01-18 VITALS — BP 104/80 | HR 93 | Temp 98.2°F | Ht 65.0 in | Wt 202.0 lb

## 2012-01-18 DIAGNOSIS — R05 Cough: Secondary | ICD-10-CM

## 2012-01-18 NOTE — Patient Instructions (Addendum)
Too bad cough not under control You can cut down your neurontin to 1- 2 times a day due to sleepiness but continue for now Continue nasal steroids and zegerid and acid reflux diet control for now Have methacholine challenge test to rule out asthma Call us after test to decide next step

## 2012-01-18 NOTE — Progress Notes (Signed)
Subjective:    Patient ID: Gail Bartlett, female    DOB: May 06, 1970, 41 y.o.   MRN: [id number]  HPI PCP is KNAPP,EVE A, MD  Referred for chronic cough. Insidious onset x 18 months. Progressive. Quality is dry. Severe in intensity. Embarasses her socially. Day time worse than night. Associaed gag +, tickle in throat  + and constant clearing of throat +. In addition  There are some vomitting episodes. No specific aggravating factors identified other than fact mdi ? advair made cough worse. Relieved only partially byt halls lozenges and hot tea. RSI Cough score is 28 and c/w LPR cough. Kouffman cough differentiator score is 1.5 for GERD and 2.5 for airway/neurogenic cough   Allergy hx  -Reportedly seen Dr [place] Callas. Found to be allergic to dust, cats, dogs, roaches.  Pulmonary hx  - she suspects spirometry at Dr [place] Callas office was normal recently. Exhaled NO - is 7 today at our office and is LOW PROB for asthma. She is not taking inahlers at the moment  BP hx  - does not have hypertension. Not on aCE inhibitor  Sinus hx  - denies any problems  GERD hx  - + for few years. Nexium no help x 1 year. Dexilant x 1 month of no help.   - Diet reviewed - though she does not drink coffee or soda or eat chocolae she does have hot spices  REC Cough is from possible sinus drainage, acid reflux,  All of this is working together to cause cyclical cough/LPR cough or IRRITABLE LARYNX  #Sinus drainage  - continue  nasal steroid generic fluticasone inhaler 2 squirts each nostril daily as advised (nurse will do script)  #Possible Acid Reflux  - continue nexium or dexilant 1 capsule daily on empty stomach (nurse will send script)   - take diet sheet from [place] - avoid colas, spices, cheeses, spirits, red meats, beer, chocolates, fried foods etc.,   - sleep with head end of bed elevated  - eat small frequent meals  - do not go to bed for 3 hours after last meal  #Cyclical cough  - please choose 2-3 days  and observe complete voice rest - no talking or whispering  - at all times there  there is urge to cough, drink water or swallow or sip on throat lozenge - Take gabapentin 300mg  once daily x 3 days, then 300mg  twice daily x 3 days, then 300mg  three times daily to continue. If this makes you too sleepy or drowsy call us and we will cut your medication dosing down   #Followup - I will see you in 4-6 weeks.  - any problems call or come sooner - cough score at fu   OV 01/18/2012   FU cough  - worse. Dry hacking cough. Present all the time. Day and night (works night shift). Dry cough. No change in character of cough. No new health issues. Below interventions have not helped. She even tried alb prn but made cough worse.    - she is not doing nasal steroids - says no one filled it  - for gerd: taking zegerid  X 1 month empty stomach in AM. Has not missed dose. However, diet control is bad - for irritable larynx: taking neurontin compliant. Not missed dose. Even did voice rest  Dr Gretta Cool Reflux Symptom Index (> 13-15 suggestive of LPR cough) 0 -> 5  =  none ->severe problem  Hoarseness of problem with voice 1  Clearing  Of Throat  5  Excess throat mucus or feeling of post nasal drip 5  Difficulty swallowing food, liquid or tablets 0  Cough after eating or lying down 4  Breathing difficulties or choking episodes 5  Troublesome or annoying cough 5  Sensation of something sticking in throat or lump in throat 0  Heartburn, chest pain, indigestion, or stomach acid coming up 3  TOTAL 28      Kouffman Reflux v Neurogenic Cough Differentiator Reflux Comments  Do you awaken from a sound sleep coughing violently?                            With trouble breathing? Yes   Do you have choking episodes when you cannot  Get enough air, gasping for air ?                 Do you usually cough when you lie down into  The bed, or when you just lie down to rest ?                             Do you  usually cough after meals or eating?         sometimw   Do you cough when (or after) you bend over?       GERD SCORE  1.5   Kouffman Reflux v Neurogenic Cough Differentiator Neurogenic   Do you more-or-less cough all day long? y   Does change of temperature make you cough? sometimes   Does laughing or chuckling cause you to cough?    Do fumes (perfume, automobile fumes, burned  Toast, etc.,) cause you to cough ?      t   Does speaking, singing, or talking on the phone cause you to cough   ?                  Neurogenic/Airway score 2.5         Review of Systems     Objective:   Physical Exam  Vitals reviewed. Constitutional: She is oriented to person, place, and time. She appears well-developed and well-nourished. No distress.       Laryngeal cough + Barking quality + Body mass index is 33.61 kg/(m^2).   HENT:  Head: Normocephalic and atraumatic.  Right Ear: External ear normal.  Left Ear: External ear normal.  Mouth/Throat: Oropharynx is clear and moist. No oropharyngeal exudate.  Eyes: Conjunctivae normal and EOM are normal. Pupils are equal, round, and reactive to light. Right eye exhibits no discharge. Left eye exhibits no discharge. No scleral icterus.  Neck: Normal range of motion. Neck supple. No JVD present. No tracheal deviation present. No thyromegaly present.  Cardiovascular: Normal rate, regular rhythm, normal heart sounds and intact distal pulses.  Exam reveals no gallop and no friction rub.   No murmur heard. Pulmonary/Chest: Effort normal and breath sounds normal. No respiratory distress. She has no wheezes. She has no rales. She exhibits no tenderness.  Abdominal: Soft. Bowel sounds are normal. She exhibits no distension and no mass. There is no tenderness. There is no rebound and no guarding.  Musculoskeletal: Normal range of motion. She exhibits no edema and no tenderness.  Lymphadenopathy:    She has no cervical adenopathy.  Neurological: She is alert and  oriented to person, place, and time. She has normal reflexes. No cranial nerve deficit. She exhibits normal muscle tone. Coordination  normal.  Skin: Skin is warm and dry. No rash noted. She is not diaphoretic. No erythema. No pallor.  Psychiatric: She has a normal mood and affect. Her behavior is normal. Judgment and thought content normal.          Assessment & Plan:

## 2012-01-21 ENCOUNTER — Ambulatory Visit (INDEPENDENT_AMBULATORY_CARE_PROVIDER_SITE_OTHER): Payer: Medicaid Other | Admitting: Family Medicine

## 2012-01-21 ENCOUNTER — Encounter: Payer: Self-pay | Admitting: Family Medicine

## 2012-01-21 VITALS — BP 92/62 | HR 68 | Ht 64.25 in | Wt 200.0 lb

## 2012-01-21 DIAGNOSIS — E039 Hypothyroidism, unspecified: Secondary | ICD-10-CM

## 2012-01-21 DIAGNOSIS — K219 Gastro-esophageal reflux disease without esophagitis: Secondary | ICD-10-CM

## 2012-01-21 DIAGNOSIS — N393 Stress incontinence (female) (male): Secondary | ICD-10-CM | POA: Insufficient documentation

## 2012-01-21 DIAGNOSIS — R05 Cough: Secondary | ICD-10-CM

## 2012-01-21 DIAGNOSIS — E559 Vitamin D deficiency, unspecified: Secondary | ICD-10-CM

## 2012-01-21 LAB — T3, FREE: T3, Free: 2.7 pg/mL (ref 2.3–4.2)

## 2012-01-21 NOTE — Patient Instructions (Addendum)
We are checking tests for your thyroid, and we will call you if dose needs to be adjusted.  Increase exercise to 30-60 minutes 5-6 days/week, and eat healthy diet, watch portion sizes and cut back on sweets.  Urinary Incontinence Your doctor wants you to have this information about urinary incontinence. This is the inability to keep urine in your body until you decide to release it. CAUSES  Prostate gland enlargement is a common cause of urinary incontinence. But there are many different causes for losing urinary control. They include:  Medicines.  Infections.  Prostate problems.  Surgery.  Neurological diseases.  Emotional factors. DIAGNOSIS  Evaluating the cause of incontinence is important in choosing the best treatment. This may require:  An ultrasound exam.  Kidney and bladder X-rays.  Cystoscopy. This is an exam of the bladder using a narrow scope. TREATMENT  For incontinent patients, normal daily hygiene and using changing pads or adult diapers regularly will prevent offensive odors and skin damage from the moisture. Changing your medicines may help control incontinence. Your caregiver may prescribe some medicines to help you regain control. Avoid caffeine. It can over-stimulate the bladder. Use the bathroom regularly. Try about every 2 to 3 hours even if you do not feel the need. Take time to empty your bladder completely. After urinating, wait a minute. Then try to urinate again. External devices used to catch urine or an indwelling urine catheter (Foley catheter) may be needed as well. Some prostate gland problems require surgery to correct. Call your caregiver for more information. Document Released: 03/01/2004 Document Revised: 04/16/2011 Document Reviewed: 02/25/2008 [facility] Patient Information 2013 [place], [place].

## 2012-01-21 NOTE — Progress Notes (Signed)
Chief Complaint  Patient presents with  . Weight Gain    3-4 week weight gain, would like to have her thyroid check. Patient states that she is seeing Dr. Marchelle Gearing @ [facility], she would like to be referref to a GI doctor instead,.   She wants thyroid checked due to weight gain.  Taking her synthroid on an empty stomach, denies any missed pills.  She denies hair/skin changes, bowel changes or mood changes. She has gained 10 pounds in the last 3 months, 15 pounds since the beginning of the year.  She is exercising at least 3x/week.  Denies change in eating habits.  She is feeling colder than normal for her.  Coughing constantly, and now is having some urinary incontinence, which is embarrassing.  She is seeing pulmonary (last seen last week).  Had PFT's and she was told it was okay. She was tried on Zegerid (medicaid wouldn't pay for another med that was originally rx'd).  She has been taking it daily, but it hasn't helped with cough at all.  She wants to be referred to GI for eval of reflux, contributing to cough.  She has never seen GI in the past.  Ongoing cough x 2.5 years, and she is very frustrated.  She had no benefit from the gabapentin.  It made her sleepy, so dose was decreased. She is scheduled for methacholine challenge test, but she wants to see GI, as pulmonary doesn't seem to be helping her.  Vitamin D deficiency--hasn't been taking her vitamins recently.   Past Medical History  Diagnosis Date  . Hypothyroidism age 27  . Impaired fasting glucose     when at 200 pounds  . GERD (gastroesophageal reflux disease)   . Trichomonas vaginalis infection 04/2011  . Allergy    Past Surgical History  Procedure Date  . Tubal ligation   . Endometrial ablation 2006 or 2007  . Cesarean section    History   Social History  . Marital Status: Single    Spouse Name: N/A    Number of Children: 2  . Years of Education: N/A   Occupational History  . CNA [employer]    Social History Main Topics  . Smoking status: Never Smoker   . Smokeless tobacco: Never Used  . Alcohol Use: No  . Drug Use: No  . Sexually Active: Not Currently   Other Topics Concern  . Not on file   Social History Narrative   Lives with son.  Daughter lives in [place], [place]  (has 1 grandson). No pets.  Currently not working.   Current Outpatient Prescriptions on File Prior to Visit  Medication Sig Dispense Refill  . gabapentin (NEURONTIN) 300 MG capsule Take 300 mg by mouth 2 (two) times daily.       . montelukast (SINGULAIR) 10 MG tablet Take 10 mg by mouth at bedtime.      Marland Kitchen SYNTHROID 100 MCG tablet Take 1 tablet (100 mcg total) by mouth daily.  90 tablet  2  . albuterol (PROVENTIL HFA;VENTOLIN HFA) 108 (90 BASE) MCG/ACT inhaler Inhale 2 puffs into the lungs every 6 (six) hours as needed for wheezing.  1 Inhaler  0  . fluticasone (FLONASE) 50 MCG/ACT nasal spray Place 2 sprays into the nose daily.  16 g  6   Current Facility-Administered Medications on File Prior to Visit  Medication Dose Route Frequency Provider Last Rate Last Dose  . measles, mumps and rubella vaccine (MMR) injection 0.5 mL  0.5 mL Subcutaneous Once Ronnald Nian, MD       No Known Allergies  ROS: Hands tingling sometimes at night.  +cough, worse in am's.  Denies fevers, sinus problems, nausea, vomiting, diarrhea. Denies heartburn. +weight gain.  Denies bowel changes.  See HPI.  PHYSICAL EXAM: BP 92/62  Pulse 68  Ht 5' 4.25" (1.632 m)  Wt 200 lb (90.719 kg)  BMI 34.06 kg/m2 Well developed, pleasant female with occasional dry cough, in no distress Neck: no lymphadenopathy, thyromegaly or mass Heart: regular rate and rhythm without murmur Lungs: clear bilaterally Abdomen: soft, nontender Extremities: no edema Skin: no rash Neuro: alert and oriented, normal gait.  Cranial nerves intact  ASSESSMENT/PLAN:  1. Unspecified hypothyroidism  TSH, T3, Free, T4, Free  2. Cough  Ambulatory referral to  Gastroenterology  3. GERD (gastroesophageal reflux disease)  Ambulatory referral to Gastroenterology  4. Unspecified vitamin D deficiency  Vitamin D, 25-hydroxy  5. SUI (stress urinary incontinence, female)     Weight gain, feeling cold--check thyroid labs, and adjust dose as indicated. Increase exercise to 30-60 minutes 5-6 days/week, and eat healthy diet, watch portion sizes and cut back on sweets.  Cough--methacholine challenge test, as planned per pulmonary.  Refer to GI per pt request.  Hasn't responded to PPI treatments for reflux  SUI--reviewed proper Kegel technique

## 2012-01-23 ENCOUNTER — Other Ambulatory Visit: Payer: Self-pay

## 2012-01-23 ENCOUNTER — Other Ambulatory Visit: Payer: Self-pay | Admitting: *Deleted

## 2012-01-23 DIAGNOSIS — E039 Hypothyroidism, unspecified: Secondary | ICD-10-CM

## 2012-01-23 MED ORDER — LEVOTHYROXINE SODIUM 112 MCG PO TABS: 112.0000 ug | ORAL_TABLET | Freq: Every day | ORAL | Status: DC

## 2012-01-23 NOTE — Telephone Encounter (Signed)
Pt informed of labs and new med change she has a nurse visit Jan 29 th for TSH recheck pt advised of apt with Dr.Kaplan LeBaurer GI Jan.22 at 8:30 and to arrive at 8:15

## 2012-01-24 ENCOUNTER — Encounter: Payer: Self-pay | Admitting: Gastroenterology

## 2012-02-02 NOTE — Assessment & Plan Note (Signed)
oo bad cough not under control You can cut down your neurontin to 1- 2 times a day due to sleepiness but continue for now Continue nasal steroids and zegerid and acid reflux diet control for now Have methacholine challenge test to rule out asthma Call us after test to decide next step

## 2012-02-06 HISTORY — PX: ESOPHAGOGASTRODUODENOSCOPY: SHX1529

## 2012-02-08 ENCOUNTER — Encounter (HOSPITAL_COMMUNITY): Payer: Medicaid Other

## 2012-02-27 ENCOUNTER — Ambulatory Visit: Payer: Medicaid Other | Admitting: Gastroenterology

## 2012-03-05 ENCOUNTER — Other Ambulatory Visit: Payer: Medicaid Other

## 2012-03-11 ENCOUNTER — Other Ambulatory Visit: Payer: Medicaid Other

## 2012-03-11 DIAGNOSIS — E039 Hypothyroidism, unspecified: Secondary | ICD-10-CM

## 2012-03-12 LAB — TSH: TSH: 1.619 u[IU]/mL (ref 0.350–4.500)

## 2012-03-13 ENCOUNTER — Other Ambulatory Visit: Payer: Self-pay | Admitting: *Deleted

## 2012-03-13 DIAGNOSIS — E039 Hypothyroidism, unspecified: Secondary | ICD-10-CM

## 2012-03-13 MED ORDER — SYNTHROID 112 MCG PO TABS: 112.0000 ug | ORAL_TABLET | Freq: Every day | ORAL | Status: DC

## 2012-03-14 ENCOUNTER — Ambulatory Visit: Payer: Medicaid Other | Admitting: Gastroenterology

## 2012-03-14 ENCOUNTER — Telehealth: Payer: Self-pay | Admitting: Gastroenterology

## 2012-03-14 NOTE — Telephone Encounter (Signed)
Message copied by Arna Snipe on Fri Mar 14, 2012  3:08 PM ------      Message from: Donata Duff      Created: Fri Mar 14, 2012  2:54 PM                   ----- Message -----         From: Donata Duff, CMA         Sent: 03/14/2012   2:54 PM           To: Donata Duff, CMA            Do not Annette Stable

## 2012-03-27 ENCOUNTER — Other Ambulatory Visit: Payer: Medicaid Other

## 2012-03-28 ENCOUNTER — Other Ambulatory Visit (INDEPENDENT_AMBULATORY_CARE_PROVIDER_SITE_OTHER): Payer: Medicaid Other

## 2012-03-28 DIAGNOSIS — Z23 Encounter for immunization: Secondary | ICD-10-CM

## 2012-04-10 ENCOUNTER — Ambulatory Visit: Payer: Medicaid Other | Admitting: Family Medicine

## 2012-04-16 ENCOUNTER — Encounter: Payer: Medicaid Other | Admitting: Family Medicine

## 2012-04-16 ENCOUNTER — Ambulatory Visit: Payer: Medicaid Other | Admitting: Family Medicine

## 2012-04-21 ENCOUNTER — Ambulatory Visit: Payer: Medicaid Other | Admitting: Family Medicine

## 2012-04-29 ENCOUNTER — Encounter: Payer: Self-pay | Admitting: Gastroenterology

## 2012-04-29 ENCOUNTER — Ambulatory Visit (INDEPENDENT_AMBULATORY_CARE_PROVIDER_SITE_OTHER): Payer: Medicaid Other | Admitting: Gastroenterology

## 2012-04-29 VITALS — BP 94/60 | HR 96 | Ht 64.5 in | Wt 199.0 lb

## 2012-04-29 DIAGNOSIS — R05 Cough: Secondary | ICD-10-CM

## 2012-04-29 DIAGNOSIS — K219 Gastro-esophageal reflux disease without esophagitis: Secondary | ICD-10-CM

## 2012-04-29 MED ORDER — ESOMEPRAZOLE MAGNESIUM 40 MG PO CPDR: 40.0000 mg | DELAYED_RELEASE_CAPSULE | Freq: Two times a day (BID) | ORAL | Status: DC

## 2012-04-29 NOTE — Progress Notes (Signed)
HPI: This is a   very pleasant 42 year old woman whom I am meeting for the first time today.  Has had cough for 2-3 years.  Mainly a dry cough.  Very aggrivating.  Causing stress urinary incontinence.  Coughs every day, multiple times per day.  No worse time day vs night.  Coughing sometimes aggrivated by heat, certain locations in town, certain foods (peanuts, certain other foods).  Had allergy testing and has no true food allergies.  She has pyrosis, not very often.  She was taking nexium, up to twice daily.  Would take it before bf.  Noticed no change in cough, no improvement in mild pyrosis.  Was given dexilant script but not approved by medicaid.    Felt to have cyclical cough by pulmonary.  Was planning on methacholine challenge.  Even smells can make her cough.  She will cough so much that will make her vomit at times  Mild intermittent dysphagia.  Rare caffeine (coffee).   Rare etoh.  No peppermint  Has gained weight lately, up about 20 pounds lately.  She feels pyrosis about once to twice per month.  Will regurg food at times Review of systems: Pertinent positive and negative review of systems were noted in the above HPI section. Complete review of systems was performed and was otherwise normal.    Past Medical History  Diagnosis Date  . Hypothyroidism age 40  . Impaired fasting glucose     when at 200 pounds  . GERD (gastroesophageal reflux disease)   . Trichomonas vaginalis infection 04/2011  . Allergy     Past Surgical History  Procedure Laterality Date  . Tubal ligation    . Endometrial ablation  2006 or 2007  . Cesarean section      Current Outpatient Prescriptions  Medication Sig Dispense Refill  . fluticasone (FLONASE) 50 MCG/ACT nasal spray Place 2 sprays into the nose daily.  16 g  6  . SYNTHROID 112 MCG tablet Take 1 tablet (112 mcg total) by mouth daily.  30 tablet  5   Current Facility-Administered Medications  Medication Dose Route Frequency  Provider Last Rate Last Dose  . measles, mumps and rubella vaccine (MMR) injection 0.5 mL  0.5 mL Subcutaneous Once Ronnald Nian, MD        Allergies as of 04/29/2012  . (No Known Allergies)    Family History  Problem Relation Age of Onset  . Hypertension Mother   . Anemia Mother     needed blood tranfusions  . Brain cancer Father   . Schizophrenia Father     paranoid  . Gout Father   . Diabetes Brother   . ADD / ADHD Daughter   . ADD / ADHD Son   . Lung cancer Maternal Grandmother     History   Social History  . Marital Status: Single    Spouse Name: N/A    Number of Children: 2  . Years of Education: N/A   Occupational History  . CNA [employer]   Social History Main Topics  . Smoking status: Never Smoker   . Smokeless tobacco: Never Used  . Alcohol Use: No  . Drug Use: No  . Sexually Active: Not Currently   Other Topics Concern  . Not on file   Social History Narrative   Lives with son.  Daughter lives in [place], [place]  (has 1 grandson). No pets.  Currently not working.       Physical Exam: BP  94/60  Pulse 96  Ht 5' 4.5" (1.638 m)  Wt 199 lb (90.266 kg)  BMI 33.64 kg/m2 Constitutional: generally well-appearing Psychiatric: alert and oriented x3 Eyes: extraocular movements intact Mouth: oral pharynx moist, no lesions Neck: supple no lymphadenopathy Cardiovascular: heart regular rate and rhythm Lungs: clear to auscultation bilaterally Abdomen: soft, nontender, nondistended, no obvious ascites, no peritoneal signs, normal bowel sounds Extremities: no lower extremity edema bilaterally Skin: no lesions on visible extremities    Assessment and plan: 42 y.o. female with  chronic cough  It seems that she really has only very intermittent pyrosis, heartburn symptoms. I explained to her that GERD, acid reflux can contribute, cause cough however in the absence of more significant typical GERD symptoms I think that and is unlikely for  her. Indeed she has been on twice-daily Nexium and has not helped at all. Lately she has not been taking any proton pump inhibitor. I advised her to restart the Nexium, and rhythm prescription. I recommended she take it 20-30 minutes before breakfast and dinner meals. Going to proceed with EGD at her soonest convenience to check for stricturing disease, significant GERD complications. She does have mild intermittent dysphagia as well. If this examination is normal then I will likely recommend she go back to see her pulmonologist and go ahead with the methacholine challenge

## 2012-04-29 NOTE — Patient Instructions (Addendum)
You will be set up for an upper endoscopy (LEC moderate sedation) for GERD, dysphagia. You should change the way you are taking your antiacid medicine (nexium) so that you are taking it 20-30 minutes prior to a decent meal as that is the way the pill is designed to work most effectively.  One pill 20-30 min before BF and dinner meals.                                               We are excited to introduce MyChart, a new best-in-class service that provides you online access to important information in your electronic medical record. We want to make it easier for you to view your health information - all in one secure location - when and where you need it. We expect MyChart will enhance the quality of care and service we provide.  When you register for MyChart, you can:    View your test results.    Request appointments and receive appointment reminders via email.    Request medication renewals.    View your medical history, allergies, medications and immunizations.    Communicate with your physician's office through a password-protected site.    Conveniently print information such as your medication lists.  To find out if MyChart is right for you, please talk to a member of our clinical staff today. We will gladly answer your questions about this free health and wellness tool.  If you are age 42 or older and want a member of your family to have access to your record, you must provide written consent by completing a proxy form available at our office. Please speak to our clinical staff about guidelines regarding accounts for patients younger than age 59.  As you activate your MyChart account and need any technical assistance, please call the MyChart technical support line at [phone]) or email your question to [email or web address]. If you email your question(s), please include your name, a return phone number and the best time to reach you.  If you have non-urgent  health-related questions, you can send a message to our office through MyChart at [email or web address]. If you have a medical emergency, call 911.  Thank you for using MyChart as your new health and wellness resource!   MyChart licensed from [employer],  [id number]. Patents Pending.

## 2012-04-30 ENCOUNTER — Telehealth: Payer: Self-pay | Admitting: Gastroenterology

## 2012-04-30 ENCOUNTER — Other Ambulatory Visit: Payer: Medicaid Other | Admitting: Gastroenterology

## 2012-04-30 NOTE — Telephone Encounter (Signed)
Pt advised to call her insurance company and ask what will they cover and we can send that in pt agreed

## 2012-05-12 ENCOUNTER — Ambulatory Visit (INDEPENDENT_AMBULATORY_CARE_PROVIDER_SITE_OTHER): Payer: Medicaid Other | Admitting: Family Medicine

## 2012-05-12 ENCOUNTER — Encounter: Payer: Self-pay | Admitting: Family Medicine

## 2012-05-12 VITALS — BP 100/64 | HR 72 | Temp 97.7°F | Ht 64.25 in | Wt 199.0 lb

## 2012-05-12 DIAGNOSIS — M79609 Pain in unspecified limb: Secondary | ICD-10-CM

## 2012-05-12 DIAGNOSIS — K219 Gastro-esophageal reflux disease without esophagitis: Secondary | ICD-10-CM

## 2012-05-12 DIAGNOSIS — M79662 Pain in left lower leg: Secondary | ICD-10-CM

## 2012-05-12 NOTE — Patient Instructions (Signed)
Bruising in left calf is likely related to a very small tear (? Of muscle--injured 1.5 weeks ago).  There is no evidence of blood clot or full tear to effect strength or function of the muscle.  Bruising will eventually resolve. The blood is going down the leg to the foot due to gravity, but no ongoing/worsening injury.  Continue with compression, elevation only as needed for swelling.  This should continue to improve over the next 1-2 weeks.  Return if increasing swelling, pain, or weakness for re-evaluation.   Check with pharmacy regarding PPI's that are covered by medicaid.  Likely they cover 40mg  of Omeprazole.  Once you find this out, call Gail Bartlett office with this info, they will prescribe.  If it doesn't work for you (you've have FAILED something that Medicaid covers) then they might be able to try and get prior authorization to get Nexium covered, which you know is effective.

## 2012-05-12 NOTE — Progress Notes (Signed)
Chief Complaint  Patient presents with  . Bleeding/Bruising    was doing hip hop abs and injured her calf with one of the exercises. She is having bruising and swelling since last Thursday.    First noted bruising L calf last Monday (1 week ago).  She injured the calf 4 days prior, while doing hip hop abs--she heard a "tear"/pull in the muscle of L calf while exercising. She needed to stop exercising.  She elevated her leg while at work.  She tried to use an ACE bandage but it was too painful  She didn't notice any swelling initially.  Didn't notice any swelling until a few days later, when she also noted bruising (1 week ago).  She has been using icy hot.  The pain is significantly improved (just barely there, some discomfort if she sits for too long).  She presents today for evaluation of the bruising, which has now moved down into her foot.  She has some ongoing swelling of the calf and ankle--ankle was very swollen yesterday.  Dr. Christella Hartigan wanted to put her back on Nexium, but apparently it isn't covered by her insurance. Has tried Zegerid over-the-counter without benefit.  Only has used Nexium, with good results in the past, other than the Zegerid.  Past Medical History  Diagnosis Date  . Hypothyroidism age 42  . Impaired fasting glucose     when at 200 pounds  . GERD (gastroesophageal reflux disease)   . Trichomonas vaginalis infection 04/2011  . Allergy    Past Surgical History  Procedure Laterality Date  . Tubal ligation    . Endometrial ablation  2006 or 2007  . Cesarean section     History   Social History  . Marital Status: Single    Spouse Name: N/A    Number of Children: 2  . Years of Education: N/A   Occupational History  . CNA [employer]   Social History Main Topics  . Smoking status: Never Smoker   . Smokeless tobacco: Never Used  . Alcohol Use: No  . Drug Use: No  . Sexually Active: Not Currently   Other Topics Concern  . Not on file    Social History Narrative   Lives with son.  Daughter lives in [place], [place]  (has 1 grandson). No pets.  Currently not working.   Current Outpatient Prescriptions on File Prior to Visit  Medication Sig Dispense Refill  . fluticasone (FLONASE) 50 MCG/ACT nasal spray Place 2 sprays into the nose daily.  16 g  6  . SYNTHROID 112 MCG tablet Take 1 tablet (112 mcg total) by mouth daily.  30 tablet  5  . esomeprazole (NEXIUM) 40 MG capsule Take 1 capsule (40 mg total) by mouth 2 (two) times daily before a meal.  60 capsule  11   Current Facility-Administered Medications on File Prior to Visit  Medication Dose Route Frequency Provider Last Rate Last Dose  . measles, mumps and rubella vaccine (MMR) injection 0.5 mL  0.5 mL Subcutaneous Once Ronnald Nian, MD       No Known Allergies  ROS:  Denies fevers, numbness, tingling, headaches, URI symptoms, chest pain, shortness of breath, other bleeding/bruising or other complaints except as per HPI  PHYSICAL EXAM: BP 100/64  Pulse 72  Temp(Src) 97.7 F (36.5 C) (Oral)  Ht 5' 4.25" (1.632 m)  Wt 199 lb (90.266 kg)  BMI 33.89 kg/m2 Well developed, pleasant female, in no distress Evaluation of left calf  reveals ecchymoses posteriorly.  No cords, focal tenderness or swelling, not indurated.  No edema, 2+ pulses.  Ecchymoses extends inferiorly, dependently, into foot, but nontender..  Strength is 5/5, normal sensation  ASSESSMENT/PLAN:  Calf pain, left  GERD (gastroesophageal reflux disease)  Possible partial tear of muscle/tendon based on amount of bruising and history.  Other than bruising today, exam is normal.  Expect continuous improvement, supportive management  GERD--advised to check with pharmacy/insurance re: covered PPI's.  Likely will need to try generic such as 40mg  omeprazole.  If tries/fails, then may need to get prior auth.  She will get this through Dr. Larae Grooms office--plans to call his office to get rx

## 2012-05-13 ENCOUNTER — Encounter: Payer: Self-pay | Admitting: Family Medicine

## 2012-05-30 ENCOUNTER — Ambulatory Visit (AMBULATORY_SURGERY_CENTER): Payer: Medicaid Other | Admitting: Gastroenterology

## 2012-05-30 ENCOUNTER — Encounter: Payer: Self-pay | Admitting: Gastroenterology

## 2012-05-30 VITALS — BP 114/79 | HR 71 | Temp 98.3°F | Resp 18 | Ht 64.0 in | Wt 199.0 lb

## 2012-05-30 DIAGNOSIS — R059 Cough, unspecified: Secondary | ICD-10-CM

## 2012-05-30 DIAGNOSIS — R05 Cough: Secondary | ICD-10-CM

## 2012-05-30 DIAGNOSIS — K219 Gastro-esophageal reflux disease without esophagitis: Secondary | ICD-10-CM

## 2012-05-30 MED ORDER — SODIUM CHLORIDE 0.9 % IV SOLN: 500.0000 mL | INTRAVENOUS | Status: DC

## 2012-05-30 NOTE — Patient Instructions (Addendum)

## 2012-05-30 NOTE — Progress Notes (Signed)
Patient did not experience any of the following events: a burn prior to discharge; a fall within the facility; wrong site/side/patient/procedure/implant event; or a hospital transfer or hospital admission upon discharge from the facility. (G8907) Patient did not have preoperative order for IV antibiotic SSI prophylaxis. (G8918)  

## 2012-05-30 NOTE — Op Note (Signed)
[facility] [address] [place], [address]   ENDOSCOPY PROCEDURE REPORT  PATIENT: Gail Bartlett, Gail Bartlett  MR#: [id number] BIRTHDATE: [birth year] , 41  yrs. old GENDER: Female ENDOSCOPIST: Rachael Fee, MD PROCEDURE DATE:  05/30/2012 PROCEDURE:  EGD, diagnostic ASA CLASS:     Class II INDICATIONS:  chronic cough, ? GERD related. MEDICATIONS: Fentanyl 75 mcg IV, Versed 5 mg IV, and These medications were titrated to patient response per physician's verbal order TOPICAL ANESTHETIC: none  DESCRIPTION OF PROCEDURE: After the risks benefits and alternatives of the procedure were thoroughly explained, informed consent was obtained.  The LB [id number] endoscope was introduced through the mouth and advanced to the second portion of the duodenum. Without limitations.  The instrument was slowly withdrawn as the mucosa was fully examined.      The upper, middle and distal third of the esophagus were carefully inspected and no abnormalities were noted.  The z-line was well seen at the GEJ.  The endoscope was pushed into the fundus which was normal including a retroflexed view.  The antrum, gastric body, first and second part of the duodenum were unremarkable. Retroflexed views revealed no abnormalities.     The scope was then withdrawn from the patient and the procedure completed.  COMPLICATIONS: There were no complications. ENDOSCOPIC IMPRESSION: Normal EGD  RECOMMENDATIONS: Please continue once daily nexium (20-30 min before breakfast meal). Since you were unable to get second daily nexium pill, start OTC omeprazole (one pill 20-30 min before dinner meal).  You should call my office in 4-5 weeks to report on your symptoms.  Has this helped your cough? You should also reschedule the methacoline challange test ordered by Dr.  Colletta Maryland.   eSigned:  Rachael Fee, MD 05/30/2012 4:09 PM

## 2012-06-02 ENCOUNTER — Telehealth: Payer: Self-pay

## 2012-06-02 NOTE — Telephone Encounter (Signed)
Left message

## 2012-08-21 ENCOUNTER — Encounter: Payer: Self-pay | Admitting: Family Medicine

## 2012-08-21 ENCOUNTER — Ambulatory Visit (INDEPENDENT_AMBULATORY_CARE_PROVIDER_SITE_OTHER): Payer: Medicaid Other | Admitting: Family Medicine

## 2012-08-21 VITALS — BP 110/68 | HR 72 | Ht 65.0 in | Wt 198.0 lb

## 2012-08-21 DIAGNOSIS — M25579 Pain in unspecified ankle and joints of unspecified foot: Secondary | ICD-10-CM

## 2012-08-21 DIAGNOSIS — R209 Unspecified disturbances of skin sensation: Secondary | ICD-10-CM

## 2012-08-21 DIAGNOSIS — J309 Allergic rhinitis, unspecified: Secondary | ICD-10-CM

## 2012-08-21 DIAGNOSIS — R05 Cough: Secondary | ICD-10-CM

## 2012-08-21 DIAGNOSIS — E559 Vitamin D deficiency, unspecified: Secondary | ICD-10-CM

## 2012-08-21 DIAGNOSIS — Z0000 Encounter for general adult medical examination without abnormal findings: Secondary | ICD-10-CM

## 2012-08-21 DIAGNOSIS — Z1231 Encounter for screening mammogram for malignant neoplasm of breast: Secondary | ICD-10-CM

## 2012-08-21 DIAGNOSIS — Z1239 Encounter for other screening for malignant neoplasm of breast: Secondary | ICD-10-CM

## 2012-08-21 DIAGNOSIS — E039 Hypothyroidism, unspecified: Secondary | ICD-10-CM

## 2012-08-21 DIAGNOSIS — R200 Anesthesia of skin: Secondary | ICD-10-CM

## 2012-08-21 LAB — POCT URINALYSIS DIPSTICK
Glucose, UA: NEGATIVE
Leukocytes, UA: NEGATIVE
Protein, UA: NEGATIVE
Urobilinogen, UA: NEGATIVE

## 2012-08-21 LAB — TSH: TSH: 0.339 u[IU]/mL — ABNORMAL LOW (ref 0.350–4.500)

## 2012-08-21 NOTE — Patient Instructions (Signed)
HEALTH MAINTENANCE RECOMMENDATIONS:  It is recommended that you get at least 30 minutes of aerobic exercise at least 5 days/week (for weight loss, you may need as much as 60-90 minutes). This can be any activity that gets your heart rate up. This can be divided in 10-15 minute intervals if needed, but try and build up your endurance at least once a week.  Weight bearing exercise is also recommended twice weekly.  Eat a healthy diet with lots of vegetables, fruits and fiber.  "Colorful" foods have a lot of vitamins (ie green vegetables, tomatoes, red peppers, etc).  Limit sweet tea, regular sodas and alcoholic beverages, all of which has a lot of calories and sugar.  Up to 1 alcoholic drink daily may be beneficial for women (unless trying to lose weight, watch sugars).  Drink a lot of water.  Calcium recommendations are 1200-1500 mg daily (1500 mg for postmenopausal women or women without ovaries), and vitamin D 1000 IU daily.  This should be obtained from diet and/or supplements (vitamins), and calcium should not be taken all at once, but in divided doses.  Monthly self breast exams and yearly mammograms for women over the age of 82 is recommended.  Sunscreen of at least SPF 30 should be used on all sun-exposed parts of the skin when outside between the hours of 10 am and 4 pm (not just when at beach or pool, but even with exercise, golf, tennis, and yard work!)  Use a sunscreen that says "broad spectrum" so it covers both UVA and UVB rays, and make sure to reapply every 1-2 hours.  Remember to change the batteries in your smoke detectors when changing your clock times in the spring and fall.  Use your seat belt every time you are in a car, and please drive safely and not be distracted with cell phones and texting while driving.   If your vitamin D level is low, you might be prescribed vitamin D again.  If it is only borderline, or normal, then you need to start taking 1000 IU of vitamin D3 every  day, longterm

## 2012-08-21 NOTE — Progress Notes (Signed)
Chief Complaint  Patient presents with  . Annual Exam    fasting annual with pap. Wants referral to ENT for her chronic sinus problems. Would also like podiatrist for B/L foot numbness. Also her left calf muscle hurting her and could you please look at her veins in her legs.    Ersilia Bartlett is a 42 y.o. female who presents for a complete physical.  She has the following concerns:  Hypothyroidism: Taking medication faithfully as directed.  15 pound weight gain since last physical 1 year ago (stable weight since December).  She denies any other thyroid symptoms.  Frustrated that she hasn't lost the weight. She denies hair/skin/bowel change  GERD: just recently started with the OTC Nexium.  Never started the PPI recommended by GI due to issues with insurance coverage.  She has noticed some improvement since using the PPI for the last few days, but still has ongoing cough.  [place] is improved.  She has a persistent cough--has seen  Allergist, pulmonary and GI.  Requesting ENT evaluation.  Reflux appears to be a component, and cough isn't as bad since being more careful with diet for reflux, but hasn't resolved, and is still very frustrating.  She is compliant with her nasal steroid spray.  She is complaining of burning in her calves when she walks. She needs to stop and massage them, and then her feet also go numb while walking.  Foot numbness has been ongoing for a while, which has limited her exercise.  Numbness starts at metatarsal heads, and spreads to the top of her feet/toes.  Occurs with walking and exercising, no other times.  Occurs bilaterally, involving the whole distal foot and toes.  Eventually self-resolves, lasts 5-10 minutes.  Occurs with any shoes that she is wearing.  She had an injury to her left calf muscle back in the spring, but she is currently having problems with both calves.  Described as a burning sensation.  Sometimes pain improves after she gets it moving--she will jog  through it.  Doesn't require rest.  Gets better after 30 minutes of walking, doesn't need to stop for pain to resolve.  Health Maintenance: Immunization History  Administered Date(s) Administered  . Influenza Whole 12/26/2011  . MMR 09/19/2011, 10/22/2011  . Tdap 04/30/2011, 03/28/2012   Last Pap smear: last year, 04/2011, showed trichomonas, otherwise normal (no HPV testing done)  Last mammogram: 3 years ago  Last colonoscopy: never  Last DEXA: never  Dentist: last year  Ophtho: never  Exercise: 4x/week  Past Medical History  Diagnosis Date  . Hypothyroidism age 74  . Impaired fasting glucose     when at 200 pounds  . GERD (gastroesophageal reflux disease)   . Trichomonas vaginalis infection 04/2011  . Allergy   . Chronic cough   . Allergic rhinitis     Past Surgical History  Procedure Laterality Date  . Tubal ligation    . Endometrial ablation  2006 or 2007  . Cesarean section      History   Social History  . Marital Status: Single    Spouse Name: N/A    Number of Children: 2  . Years of Education: N/A   Occupational History  . CNA [employer]   Social History Main Topics  . Smoking status: Never Smoker   . Smokeless tobacco: Never Used  . Alcohol Use: No  . Drug Use: No  . Sexually Active: Yes -- Female partner(s)    Birth Control/ Protection:  Condom   Other Topics Concern  . Not on file   Social History Narrative   Lives with son.  Daughter lives in [place], [place]  (has 1 grandson). No pets.  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 Hypertension Mother   . Anemia Mother     needed blood tranfusions  . Brain cancer Father   . Schizophrenia Father     paranoid  . Gout Father   . Diabetes Brother   . ADD / ADHD Daughter   . ADD / ADHD Son   . Lung cancer Maternal Grandmother   . Cancer Maternal Grandmother   . Colon cancer  Neg Hx   . Breast cancer Neg Hx     Current outpatient prescriptions:esomeprazole (NEXIUM) 20 MG capsule, Take 20 mg by mouth 2 (two) times daily., Disp: , Rfl: ;  fluticasone (FLONASE) 50 MCG/ACT nasal spray, Place 2 sprays into the nose daily., Disp: 16 g, Rfl: 6;  SYNTHROID 112 MCG tablet, Take 1 tablet (112 mcg total) by mouth daily., Disp: 30 tablet, Rfl: 5 Current facility-administered medications:measles, mumps and rubella vaccine (MMR) injection 0.5 mL, 0.5 mL, Subcutaneous, Once, Ronnald Nian, MD  No Known Allergies  ROS: The patient denies anorexia, fever, headaches, vision changes, decreased hearing, ear pain, sore throat, breast concerns, chest pain, palpitations, dizziness, syncope, dyspnea on exertion, swelling, nausea, vomiting, diarrhea, constipation, abdominal pain, melena, hematochezia, hematuria, dysuria, vaginal discharge, odor or itch, genital lesions, joint pains, weakness, tremor, suspicious skin lesions, depression, anxiety, abnormal bleeding/bruising, or enlarged lymph nodes.  No vaginal bleeding since ablation. Occasional sciatic nerve pain R leg +cough   PHYSICAL EXAM: BP 110/68  Pulse 72  Ht 5\' 5"  (1.651 m)  Wt 198 lb (89.812 kg)  BMI 32.95 kg/m2  General Appearance:  Alert, cooperative, no distress, appears stated age. No coughing during the visit  Head:  Normocephalic, without obvious abnormality, atraumatic   Eyes:  PERRL, conjunctiva/corneas clear, EOM's intact, fundi  benign   Ears:  Normal TM's and external ear canals   Nose:  Nares normal, mucosa normal, no drainage or sinus tenderness. Nasal mucosa mildly edematous, pale   Throat:  Lips, mucosa, and tongue normal; teeth and gums normal   Neck:  Supple, no lymphadenopathy; thyroid: no enlargement/tenderness/nodules; no carotid  bruit or JVD   Back:  Spine nontender, no curvature, ROM normal, no CVA tenderness   Lungs:  Clear to auscultation bilaterally without wheezes, rales or ronchi; respirations  unlabored   Chest Wall:  No tenderness or deformity   Heart:  Regular rate and rhythm, S1 and S2 normal, no murmur, rub  or gallop   Breast Exam:  No tenderness, masses, or nipple discharge or inversion. No axillary lymphadenopathy. Diffuse fibrocystic changes bilaterally, no discrete mass   Abdomen:  Soft, non-tender, nondistended, normoactive bowel sounds,  no masses, no hepatosplenomegaly   Genitalia:  Normal external genitalia without lesions. BUS and vagina normal; no cervical motion tenderness. No abnormal vaginal discharge. Uterus and adnexa not enlarged, nontender, no masses. Pap not performed   Rectal:  Normal tone, no masses or tenderness; guaiac negative stool   Extremities:  No clubbing, cyanosis or edema   Pulses:  2+ and symmetric all extremities   Skin:  Skin color, texture, turgor normal, no rashes or lesions   Lymph nodes:  Cervical, supraclavicular, and axillary nodes normal  Neurologic:  CNII-XII intact, normal strength, sensation and gait; reflexes 2+ and symmetric throughout          Psych: Normal mood, affect, hygiene and grooming.   ASSESSMENT/PLAN:  Routine general medical examination at a health care facility - Plan: Visual acuity screening, POCT Urinalysis Dipstick, Glucose, random, MM Digital Screening, GC/Chlamydia Probe Amp  Unspecified vitamin D deficiency - Plan: Vitamin D 25 hydroxy  Unspecified hypothyroidism - Plan: TSH  Chronic cough - known contributors include allergies and reflux, however hasn't had complete resolution with their treatment.  She requests ENT consult - Plan: Ambulatory referral to ENT  Allergic rhinitis, cause unspecified - Plan: Ambulatory referral to ENT  Screening for breast cancer - Plan: MM Digital Screening  Pain in joint, ankle and foot, unspecified laterality - Plan: Ambulatory referral to Podiatry  Numbness of foot - r/o DM.  refer to podiatry per pt request.  normal exam without evidence of vascular problem or neuropathy  today - Plan: Ambulatory referral to Podiatry

## 2012-08-22 DIAGNOSIS — R05 Cough: Secondary | ICD-10-CM | POA: Insufficient documentation

## 2012-08-22 LAB — VITAMIN D 25 HYDROXY (VIT D DEFICIENCY, FRACTURES): Vit D, 25-Hydroxy: 31 ng/mL (ref 30–89)

## 2012-08-22 LAB — GC/CHLAMYDIA PROBE AMP
CT Probe RNA: NEGATIVE
GC Probe RNA: NEGATIVE

## 2012-08-25 ENCOUNTER — Other Ambulatory Visit: Payer: Self-pay | Admitting: *Deleted

## 2012-08-25 DIAGNOSIS — E039 Hypothyroidism, unspecified: Secondary | ICD-10-CM

## 2012-08-25 MED ORDER — SYNTHROID 100 MCG PO TABS: 100.0000 ug | ORAL_TABLET | Freq: Every day | ORAL | Status: DC

## 2012-09-05 ENCOUNTER — Ambulatory Visit (INDEPENDENT_AMBULATORY_CARE_PROVIDER_SITE_OTHER): Payer: Medicaid Other | Admitting: Medical

## 2012-09-05 ENCOUNTER — Encounter: Payer: Self-pay | Admitting: Medical

## 2012-09-05 VITALS — BP 102/64 | HR 72 | Temp 97.6°F | Ht 65.0 in | Wt 199.0 lb

## 2012-09-05 DIAGNOSIS — M542 Cervicalgia: Secondary | ICD-10-CM

## 2012-09-05 DIAGNOSIS — M25512 Pain in left shoulder: Secondary | ICD-10-CM

## 2012-09-05 DIAGNOSIS — M62830 Muscle spasm of back: Secondary | ICD-10-CM

## 2012-09-05 DIAGNOSIS — M5380 Other specified dorsopathies, site unspecified: Secondary | ICD-10-CM

## 2012-09-05 DIAGNOSIS — M25519 Pain in unspecified shoulder: Secondary | ICD-10-CM

## 2012-09-05 MED ORDER — NAPROXEN 375 MG PO TABS: 375.0000 mg | ORAL_TABLET | Freq: Two times a day (BID) | ORAL | Status: DC

## 2012-09-05 MED ORDER — METHOCARBAMOL 500 MG PO TABS: ORAL_TABLET | ORAL | Status: DC

## 2012-09-05 NOTE — Patient Instructions (Signed)
Your exam and symptoms today suggest decreased range of motion in left shoulder as well as shoulder spasm and tension.  Begin daily stretching and range of motion routine to loosen the shoulder and neck, but also to increase your shoulder range of motion gradually over the next few weeks.  Use heat as needed to the neck and shoulder  Use Robaxin muscle relaxer at bedtime or up to 3 times daily for spasm.   This is as needed, not necessarily every day.  This may cause drowsiness.  Begin Naprosyn 375mg  twice daily for 7-10 days for now for pain and inflammation.  Take this with food.   Go get a massage.   If not improving in 2 weeks, recheck.

## 2012-09-05 NOTE — Progress Notes (Signed)
Subjective: Here for left shoulder pain.  Thinks it has been going on long enough that now causing neck pain too.  When carrying heavy bag on left shoulder, makes it worse.  Been having pain in left shoulder 4-5 months.   Worse with moving a certain way, such as overhead or external ROM.   Can't sleep on that side sometimes due to pain.  No numbness, tingling, weakness.  Neck pain just started recently.   Neck pain is soreness, was stiff the beginning of the week.  Denies specific injury, trauma, fall.  Right handed.  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  Exercises - jogging, but no weight lifting, no calisthenics.  No other aggravating or relieving factors.   ROS as in subjective  Objective: Filed Vitals:   09/05/12 0808  BP: 102/64  Pulse: 72  Temp: 97.6 F (36.4 C)    General appearence: alert, no distress, WD/WN Neck: supple, no lymphadenopathy, no thyromegaly, no masses, nontender, normal ROM MSK: tender over left upper back paraspinal and trapezoid, shoulder and arm nontender, internal left shoulder ROM somewhat reduced otherwise normal shoulder ROM, negative special tests, no swelling or deformity  UE pulses normal Neuro: normal arm and UE strength, sensation, DTRs.  Assessment: Encounter Diagnoses  Name Primary?  . Pain in joint, shoulder region, left Yes  . Muscle spasm of back   . Cervicalgia      Plan: Current exam and symptoms don't suggest tear, labral issue or specifically a should problem.   Seems more related to spasm and tension in trapezoid and supraspinatus.  Will approach conservatively.  Demonstrated several stretches.   Patient Instructions  Your exam and symptoms today suggest decreased range of motion in left shoulder as well as shoulder spasm and tension.  Begin daily stretching and range of motion routine to loosen the shoulder and neck, but also to increase your shoulder range of motion gradually over the next few  weeks.  Use heat as needed to the neck and shoulder  Use Robaxin muscle relaxer at bedtime or up to 3 times daily for spasm.   This is as needed, not necessarily every day.  This may cause drowsiness.  Begin Naprosyn 375mg  twice daily for 7-10 days for now for pain and inflammation.  Take this with food.   Go get a massage.   If not improving in 2 weeks, recheck.

## 2012-09-12 ENCOUNTER — Other Ambulatory Visit: Payer: Self-pay | Admitting: Otolaryngology

## 2012-09-12 DIAGNOSIS — K219 Gastro-esophageal reflux disease without esophagitis: Secondary | ICD-10-CM

## 2012-09-12 DIAGNOSIS — J387 Other diseases of larynx: Secondary | ICD-10-CM

## 2012-09-12 DIAGNOSIS — R05 Cough: Secondary | ICD-10-CM

## 2012-09-12 DIAGNOSIS — J329 Chronic sinusitis, unspecified: Secondary | ICD-10-CM

## 2012-09-19 ENCOUNTER — Ambulatory Visit
Admission: RE | Admit: 2012-09-19 | Discharge: 2012-09-19 | Disposition: A | Discharge status: Home / Self Care | Payer: Medicaid Other | Source: Ambulatory Visit | Attending: Otolaryngology | Admitting: Otolaryngology

## 2012-09-19 ENCOUNTER — Ambulatory Visit
Admission: RE | Admit: 2012-09-19 | Discharge: 2012-09-19 | Disposition: A | Discharge status: Home / Self Care | Payer: Medicaid Other | Source: Ambulatory Visit | Attending: Family Medicine | Admitting: Family Medicine

## 2012-09-19 DIAGNOSIS — K219 Gastro-esophageal reflux disease without esophagitis: Secondary | ICD-10-CM

## 2012-09-19 DIAGNOSIS — R05 Cough: Secondary | ICD-10-CM

## 2012-09-19 DIAGNOSIS — J387 Other diseases of larynx: Secondary | ICD-10-CM

## 2012-09-19 DIAGNOSIS — Z1231 Encounter for screening mammogram for malignant neoplasm of breast: Secondary | ICD-10-CM

## 2012-09-19 DIAGNOSIS — J329 Chronic sinusitis, unspecified: Secondary | ICD-10-CM

## 2012-11-10 ENCOUNTER — Other Ambulatory Visit: Payer: Self-pay

## 2012-11-11 ENCOUNTER — Other Ambulatory Visit: Payer: Self-pay

## 2012-11-17 ENCOUNTER — Other Ambulatory Visit (INDEPENDENT_AMBULATORY_CARE_PROVIDER_SITE_OTHER): Payer: Medicaid Other

## 2012-11-17 DIAGNOSIS — E039 Hypothyroidism, unspecified: Secondary | ICD-10-CM

## 2012-11-17 DIAGNOSIS — Z23 Encounter for immunization: Secondary | ICD-10-CM

## 2012-11-19 ENCOUNTER — Other Ambulatory Visit: Payer: Self-pay | Admitting: *Deleted

## 2012-11-19 MED ORDER — SYNTHROID 100 MCG PO TABS: 100.0000 ug | ORAL_TABLET | Freq: Every day | ORAL | Status: DC

## 2012-11-26 ENCOUNTER — Encounter: Payer: Self-pay | Admitting: Family Medicine

## 2012-11-26 ENCOUNTER — Ambulatory Visit (INDEPENDENT_AMBULATORY_CARE_PROVIDER_SITE_OTHER): Payer: Medicaid Other | Admitting: Family Medicine

## 2012-11-26 VITALS — BP 108/62 | HR 72 | Ht 65.0 in | Wt 200.0 lb

## 2012-11-26 DIAGNOSIS — R102 Pelvic and perineal pain: Secondary | ICD-10-CM

## 2012-11-26 DIAGNOSIS — N760 Acute vaginitis: Secondary | ICD-10-CM

## 2012-11-26 LAB — POCT WET PREP (WET MOUNT)

## 2012-11-26 LAB — POCT URINALYSIS DIPSTICK
Bilirubin, UA: NEGATIVE
Ketones, UA: NEGATIVE
Protein, UA: NEGATIVE
Spec Grav, UA: <=1.005
pH, UA: >=9

## 2012-11-26 MED ORDER — TERCONAZOLE 0.4 % VA CREA: 1.0000 | TOPICAL_CREAM | Freq: Every day | VAGINAL | Status: DC

## 2012-11-26 NOTE — Patient Instructions (Signed)
Use the terazol as directed.   Call if yeast symptoms (thick discharge, itch) do not resolve.  Return for re-evaluation if new symptoms develop (fever, worsening pelvic pain, ongoing discharge that is yellow, foul smelling).  Avoid douching.

## 2012-11-26 NOTE — Progress Notes (Signed)
Chief Complaint  Patient presents with  . Vaginal Discharge    white discharge, no itch x 10 days. Does note that there is an odor.     Complaining of a vaginal discharge for a couple of weeks.  It looks like cottage cheese, but is not itchy (very mildly, maybe).  She does notice an odor (not described as fishy).  She uses condoms regularly.  She is in monogamous relationship for about a year.  Had GC and chlamydia screening done in July, which was negative.  She is having some pelvic pain--felt crampy, similar to period pain.  Denies any dysuria, urgency, frequency.  She admits that she has been douching recently.  Pt has h/o yeast infections, and reports that terazol is usually the most effective medication.  She was last treated with antibiotics in August, for chronic sinusiits ([facility], rx'd Bactrim).  Cough is much improved since then.  Past Medical History  Diagnosis Date  . Hypothyroidism age 16  . Impaired fasting glucose     when at 200 pounds  . GERD (gastroesophageal reflux disease)   . Trichomonas vaginalis infection 04/2011  . Allergy   . Chronic cough   . Allergic rhinitis    Past Surgical History  Procedure Laterality Date  . Tubal ligation    . Endometrial ablation  2006 or 2007  . Cesarean section     History   Social History  . Marital Status: Single    Spouse Name: N/A    Number of Children: 2  . Years of Education: N/A   Occupational History  . CNA [employer]   Social History Main Topics  . Smoking status: Never Smoker   . Smokeless tobacco: Never Used  . Alcohol Use: No  . Drug Use: No  . Sexual Activity: Yes    Partners: Male    Birth Control/ Protection: Condom   Other Topics Concern  . Not on file   Social History Narrative   Lives with son.  Daughter lives in [place], [place]  (has 1 grandson). No pets.  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, currently on [place]); working per diem   Current  outpatient prescriptions:fluticasone (FLONASE) 50 MCG/ACT nasal spray, Place 2 sprays into the nose daily., Disp: 16 g, Rfl: 6;  SYNTHROID 100 MCG tablet, Take 1 tablet (100 mcg total) by mouth daily before breakfast., Disp: 30 tablet, Rfl: 5;  methocarbamol (ROBAXIN) 500 MG tablet, 1 tablet po prn up to TID for muscle spasm, Disp: 20 tablet, Rfl: 0 naproxen (NAPROSYN) 375 MG tablet, Take 1 tablet (375 mg total) by mouth 2 (two) times daily with a meal., Disp: 30 tablet, Rfl: 0;  terconazole (TERAZOL 7) 0.4 % vaginal cream, Place 1 applicator vaginally at bedtime., Disp: 45 g, Rfl: 0 Current facility-administered medications:measles, mumps and rubella vaccine (MMR) injection 0.5 mL, 0.5 mL, Subcutaneous, Once, Ronnald Nian, MD  No Known Allergies  ROs:  Denies fever, chills, headaches, dizziness, chest pain, shortness of breath, nausea, vomiting.  Cough much improved.  No sinus pain.  No dysuria.  +vaginal discharge. No abdominal or pelvic pain.  No bleeding, bruising  PHYSICAL EXAM: BP 108/62  Pulse 72  Ht 5\' 5"  (1.651 m)  Wt 200 lb (90.719 kg)  BMI 33.28 kg/m2 Well developed, pleasant female in no distress Abdomen: Tenderness over lower abdomen/suprapubically Back: no CVA tenderness External genitalia normal without lesions.  Moderate amount of discharge--some creamy/thin and white, but also  very thick, cottage cheesy white discharge noted deeper in vaginal vault.  No cervical motion tenderness or adnexal tenderness or masses, just some diffuse tenderness anteriorly (over bladder) and diffusely.  Urine dip: trace leuks KOH/wet prep--many squams.  No clue cells, no bacterial.  Many hyphae  ASSESSMENT/PLAN:  Vaginitis and vulvovaginitis - Plan: POCT Wet Prep [facility]), terconazole (TERAZOL 7) 0.4 % vaginal cream  Pelvic pain - Plan: POCT Urinalysis Dipstick

## 2012-12-04 ENCOUNTER — Other Ambulatory Visit: Payer: Self-pay | Admitting: Family Medicine

## 2012-12-15 ENCOUNTER — Ambulatory Visit: Payer: Medicaid Other | Admitting: Family Medicine

## 2013-01-20 ENCOUNTER — Ambulatory Visit (INDEPENDENT_AMBULATORY_CARE_PROVIDER_SITE_OTHER): Payer: Medicaid Other | Admitting: Medical

## 2013-01-20 ENCOUNTER — Encounter: Payer: Self-pay | Admitting: Medical

## 2013-01-20 VITALS — BP 110/70 | HR 88 | Temp 98.4°F | Resp 18 | Wt 202.0 lb

## 2013-01-20 DIAGNOSIS — J069 Acute upper respiratory infection, unspecified: Secondary | ICD-10-CM

## 2013-01-20 MED ORDER — HYDROCODONE-HOMATROPINE 5-1.5 MG/5ML PO SYRP: 5.0000 mL | ORAL_SOLUTION | Freq: Three times a day (TID) | ORAL | Status: DC | PRN

## 2013-01-20 NOTE — Progress Notes (Signed)
Subjective:  Gail Bartlett is a 42 y.o. female who presents for few day hx/o cough, head pressure, runny nose, mild ear discomfort, head congestion, chest congestion, coughing up clear mucous, some nausea with mucous coming up.  Felt wheezy the other night.  Denies fever, vomiting, no body aches, no sweats.  Treatment to date: Mucinex DM.  +sick contacts, works in hospital.  No other aggravating or relieving factors.  No other c/o.  ROS as in subjective.  Past Medical History  Diagnosis Date  . Hypothyroidism age 85  . Impaired fasting glucose     when at 200 pounds  . GERD (gastroesophageal reflux disease)   . Trichomonas vaginalis infection 04/2011  . Allergy   . Chronic cough   . Allergic rhinitis     Objective: Filed Vitals:   01/20/13 1335  BP: 110/70  Pulse: 88  Temp: 98.4 F (36.9 C)  Resp: 18    General appearance: Alert, WD/WN, no distress, mildly ill appearing                             Skin: warm, no rash                           Head: mild frontal sinus tenderness                            Eyes: conjunctiva normal, corneas clear, PERRLA                            Ears: left TM with mild erythema, serous effusion clear, right TM pearly, external ear canals normal                          Nose: septum midline, turbinates swollen, with erythema and clear discharge             Mouth/throat: MMM, tongue normal, mild pharyngeal erythema                           Neck: supple, no adenopathy, no thyromegaly, nontender                          Heart: RRR, normal S1, S2, no murmurs                         Lungs: CTA bilaterally, no wheezes, rales, or rhonchi     Assessment: Encounter Diagnosis  Name Primary?  Marland Kitchen Upper respiratory infection Yes     Plan: Discussed diagnosis and treatment of URI.  Suggested symptomatic OTC remedies.  Nasal saline spray for congestion.  Tylenol or Ibuprofen OTC for fever and malaise.  Hycodan prn QHS for worse cough. Call/return in  2-3 days if symptoms aren't resolving.

## 2013-01-27 ENCOUNTER — Ambulatory Visit (INDEPENDENT_AMBULATORY_CARE_PROVIDER_SITE_OTHER): Payer: Medicaid Other | Admitting: Family Medicine

## 2013-01-27 VITALS — BP 114/80 | HR 108 | Wt 203.0 lb

## 2013-01-27 DIAGNOSIS — N760 Acute vaginitis: Secondary | ICD-10-CM

## 2013-01-27 NOTE — Progress Notes (Signed)
   Subjective:    Patient ID: Gail Bartlett, female    DOB: [birth year], 42 y.o.   MRN: [id number]  HPI She complains of vaginal itching and slight swelling after she used Gold Bond.   Review of Systems     Objective:   Physical Exam Exam of the vaginal area does show slight erythema and swelling. No discharge was noted. KOH and wet prep are negative.       Assessment & Plan:  Vaginitis  recommend cortisone cream for this area.

## 2013-02-18 ENCOUNTER — Ambulatory Visit (INDEPENDENT_AMBULATORY_CARE_PROVIDER_SITE_OTHER): Payer: Medicaid Other | Admitting: Family Medicine

## 2013-02-18 ENCOUNTER — Encounter: Payer: Self-pay | Admitting: Family Medicine

## 2013-02-18 VITALS — BP 110/64 | HR 76 | Temp 97.7°F | Ht 65.0 in | Wt 207.0 lb

## 2013-02-18 DIAGNOSIS — L299 Pruritus, unspecified: Secondary | ICD-10-CM

## 2013-02-18 NOTE — Patient Instructions (Signed)
  There does not appear to be any infection (fungal or bacterial). You might be having a slight allergic reaction on your neck. Use hydrocortisone cream (1%) 2-3 times daily as needed for itching. Avoid scented creams and lotions for now, since you seem to be sensitive.  Your skin appears dry--make sure to drink plenty of fluids, avoid prolonged hot showers/baths, and to moisturize twice daily.  Your c-section scar is just minimally irritated.  No evidence of yeast infection.  Because two of the irritated areas are in skin folds, make sure that skin is kept dry (and not moist from perspiration).  You can use cortisone cream as needed for itching, but if it just remains a little irritated, consider a barrier treatment such as Desitin or zinc oxide to protect the skin.

## 2013-02-18 NOTE — Progress Notes (Signed)
Chief Complaint  Patient presents with  . Rash    seen for vaginitis 10/22 ad 12/23 that she did have on her buttocks as well. Vaginitis cleard up but still having issues on her buttocks.    She is having itching on her buttocks, scratched so hard that she scratched the skin off.  The itching is between the buttocks, in the crack.  She tries to not scratch it, but she starts rubbing it instead, and after rubbing it makes it very red.  She doesn't believe there was a rash prior to her rubbing and scratching, that it was just pruritic.  She used Veet for pubic hair removal.  Yesterday she saw that her c-section scar was raw, and somewhat itchy, not painful.  She doesn't know if she could have gotten the Veet in this area or not.  It was red and pink today, although not itchy, just a little uncomfortable.  She was seen by Dr. Redmond School 12/23 for vaginal itching--thinks she had allergic reaction to perfume. Gold Bond had helped some. No yeast was seen, and cortisone cream was recommended.  She was treated with Terazol in October for yeast vaginitis.  Currently denies any vaginal discharge or itching.  She also notes a rash on her neck (left side). She doesn't usually use body washes, but she has been using them recently.  Past Medical History  Diagnosis Date  . Hypothyroidism age 41  . Impaired fasting glucose     when at 200 pounds  . GERD (gastroesophageal reflux disease)   . Trichomonas vaginalis infection 04/2011  . Allergy   . Chronic cough   . Allergic rhinitis    Past Surgical History  Procedure Laterality Date  . Tubal ligation    . Endometrial ablation  2006 or 2007  . Cesarean section     History   Social History  . Marital Status: Single    Spouse Name: N/A    Number of Children: 2  . 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 Smoking status: Never Smoker   . Smokeless tobacco: Never Used  . Alcohol  Use: No  . Drug Use: No  . Sexual Activity: Yes    Partners: Male    Birth Control/ Protection: Condom   Other Topics Concern  . Not on file   Social History Narrative   Lives with son.  Daughter lives in [place], [place]  (has 1 grandson). No pets.  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 Denies URI symptoms, cough, shortness of breath, chest pain, palpitations or other concerns.  PHYSICAL EXAM: BP 110/64  Pulse 76  Temp(Src) 97.7 F (36.5 C) (Oral)  Ht '5\' 5"'  (1.651 m)  Wt 207 lb (93.895 kg)  BMI 34.45 kg/m2 Well developed, pleasant female in no distress Skin:  Left neck--there is a small patch of hyperpigmentation, no central clearing, minimally  raised Abdomen: at her c-section, the right side of the scar is slightly pink. Surrounding skin is normal.  Some mild "shave bumps", inflamed follicles in multiple areas, nontender, without erythema. Buttocks:  At gluteal fold--no plaque or lesions.  On the left buttock there is hypopigmentation (likely healing abrasion), and at the edge there is just slight scaliness (also appears to be healing abrasion).  No satellite lesions, erythema or other lesions.  ASSESSMENT/PLAN:  Pruritic dermatitis  Probably contact dermatitis of left neck.   Minimal irritation of c-section scar Pruritis in peri-rectal area, without evidence of rash, just evidence of scratching  with healing abrasion.  There does not appear to be any infection (fungal or bacterial). You might be having a slight allergic reaction on your neck. Use hydrocortisone cream (1%) 2-3 times daily as needed for itching. Avoid scented creams and lotions for now, since you seem to be sensitive.  Your skin appears dry--make sure to drink plenty of fluids, avoid prolonged hot showers/baths, and to moisturize twice daily.  Your c-section scar is just minimally irritated.  No evidence of yeast infection.  Because two of the irritated areas are in skin folds, make sure that skin is kept dry (and not moist from perspiration).  You can use cortisone cream as needed for itching, but if it just remains a little irritated, consider a barrier treatment such as Desitin or zinc oxide to protect the skin.

## 2013-05-18 ENCOUNTER — Ambulatory Visit: Payer: Self-pay | Admitting: Family Medicine

## 2013-05-20 ENCOUNTER — Ambulatory Visit (INDEPENDENT_AMBULATORY_CARE_PROVIDER_SITE_OTHER): Payer: Medicaid Other | Admitting: Family Medicine

## 2013-05-20 ENCOUNTER — Encounter: Payer: Self-pay | Admitting: Family Medicine

## 2013-05-20 VITALS — BP 100/70 | HR 72 | Ht 65.0 in | Wt 207.0 lb

## 2013-05-20 DIAGNOSIS — R5383 Other fatigue: Secondary | ICD-10-CM

## 2013-05-20 DIAGNOSIS — R5381 Other malaise: Secondary | ICD-10-CM

## 2013-05-20 DIAGNOSIS — E039 Hypothyroidism, unspecified: Secondary | ICD-10-CM

## 2013-05-20 DIAGNOSIS — E559 Vitamin D deficiency, unspecified: Secondary | ICD-10-CM

## 2013-05-20 LAB — CBC WITH DIFFERENTIAL/PLATELET
BASOS ABS: 0 10*3/uL (ref 0.0–0.1)
Basophils Relative: 0 % (ref 0–1)
Eosinophils Absolute: 0.2 10*3/uL (ref 0.0–0.7)
Eosinophils Relative: 2 % (ref 0–5)
HEMATOCRIT: 39.8 % (ref 36.0–46.0)
Hemoglobin: 13.4 g/dL (ref 12.0–15.0)
LYMPHS PCT: 28 % (ref 12–46)
Lymphs Abs: 3.3 10*3/uL (ref 0.7–4.0)
MCH: 26.9 pg (ref 26.0–34.0)
MCHC: 33.7 g/dL (ref 30.0–36.0)
MCV: 79.8 fL (ref 78.0–100.0)
MONO ABS: 0.7 10*3/uL (ref 0.1–1.0)
Monocytes Relative: 6 % (ref 3–12)
NEUTROS ABS: 7.6 10*3/uL (ref 1.7–7.7)
Neutrophils Relative %: 64 % (ref 43–77)
PLATELETS: 308 10*3/uL (ref 150–400)
RBC: 4.99 MIL/uL (ref 3.87–5.11)
RDW: 14.7 % (ref 11.5–15.5)
WBC: 11.8 10*3/uL — AB (ref 4.0–10.5)

## 2013-05-20 LAB — TSH: TSH: 2.2 u[IU]/mL (ref 0.350–4.500)

## 2013-05-20 LAB — COMPREHENSIVE METABOLIC PANEL
ALBUMIN: 3.7 g/dL (ref 3.5–5.2)
ALT: 19 U/L (ref 0–35)
AST: 18 U/L (ref 0–37)
Alkaline Phosphatase: 127 U/L — ABNORMAL HIGH (ref 39–117)
BUN: 13 mg/dL (ref 6–23)
CALCIUM: 9 mg/dL (ref 8.4–10.5)
CHLORIDE: 104 meq/L (ref 96–112)
CO2: 28 mEq/L (ref 19–32)
Creat: 0.89 mg/dL (ref 0.50–1.10)
Glucose, Bld: 75 mg/dL (ref 70–99)
POTASSIUM: 4.1 meq/L (ref 3.5–5.3)
Sodium: 141 mEq/L (ref 135–145)
TOTAL PROTEIN: 6.7 g/dL (ref 6.0–8.3)
Total Bilirubin: 0.2 mg/dL (ref 0.2–1.2)

## 2013-05-20 NOTE — Progress Notes (Signed)
Chief Complaint  Patient presents with  . Advice Only    patient requesting thyroid panel. States that she is having mental fogginess, she is fatigued, feels cold. Also wants to discuss her weight. Feels like she is doing everything right, and each year her weight goes up a few lbs.    She presents feeling fatigued, despite getting 8 hours of sleep. This has been going on for a couple of months.  She is compliant with taking her thyroid medication on an empty stomach, separate from other medications/vitamins. No missed doses. Denies hair loss (has extensions), skin changes, bowel changes, mood changes.  She has gained weight, which bothers her a lot.  Review of chart shows no change in the last 3 months, but up 7 pounds in the last 6 months. Her chief complaint is "brain fog".  Denies snoring or apnea (her boyfriend and son do not tell her she snores).  Some nights she doesn't sleep very deeply.    She hasn't been taking Vitamin D recently--she was concerned about possible side effects after talking to some friends about OTC vitamins, Vitamin D.  She has h/o low vitamin D (level was 20 two years ago), and last two checks were borderline low at 31.  She has taken some gummy vitamins, but not recently (not for a month).  She also has iron, but hasn't been taking them recently. She was taking this because she is always cold, not because she was told she was iron deficient. It didn't really seem to help. She has had endometrial ablation, so hasn't had a period in years.  She is asking to have liver and kidney tests done--her mother is on dialysis, and she was curious if she would be able to donate a kidney.  Allergies are not flaring; she hasn't needed to use her Flonase--usually needs is in the wintertime, doesn't need it in the Spring. Still has chronic cough, but improved.  Past Medical History  Diagnosis Date  . Hypothyroidism age 43  . Impaired fasting glucose     when at 200 pounds  . GERD  (gastroesophageal reflux disease)   . Trichomonas vaginalis infection 04/2011  . Allergy   . Chronic cough   . Allergic rhinitis    Past Surgical History  Procedure Laterality Date  . Tubal ligation    . Endometrial ablation  2006 or 2007  . Cesarean section     History   Social History  . Marital Status: Single    Spouse Name: N/A    Number of Children: 2  . Years of Education: N/A   Occupational History  . CNA [employer]   Social History Main Topics  . Smoking status: Never Smoker   . Smokeless tobacco: Never Used  . Alcohol Use: No  . Drug Use: No  . Sexual Activity: Yes    Partners: Male    Birth Control/ Protection: Condom   Other Topics Concern  . Not on file   Social History Narrative   Lives with son.  [place] in [place], [place]  (has 1 grandson). No pets.  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 SYNTHROID 100 MCG tablet Take 1 tablet (100 mcg total) by mouth daily before breakfast.  . fluticasone (FLONASE) 50 MCG/ACT nasal spray Place 2 sprays into the nose daily.   No Known Allergies  ROS: No fevers, chills, URI symptoms, shortness of breath.  Cough hasn't been as bad as in the past, still coughs occasionally.  Denies nausea, diarrhea.  She vomited yesterday after eating steak (she hadn't eaten it in over 13 years). No urinary complaints.  See HPI.  Denies depression.  PHYSICAL EXAM: BP 100/70  Pulse 72  Ht 5\' 5"  (1.651 m)  Wt 207 lb (93.895 kg)  BMI 34.45 kg/m2 Pleasant female, in good spirits; doesn't appear fatigued HEENT:  PERRL, EOMI, conjunctiva clear.  OP clear Neck: no lymphadenopathy, thyromegaly or mass Heart: regular rate and rhythm without murmur Lungs: clear bilaterally Abdomen: soft, nontender, no organomegaly or mass Extremities: no edema Psych: normal mood, affect, hygiene and  grooming Neuro: alert and oriented. Normal strength, gait, cranial nerves.  ASSESSMENT/PLAN:  Unspecified hypothyroidism - Plan: TSH  Unspecified vitamin D deficiency - Plan: Vit D  25 hydroxy (rtn osteoporosis monitoring)  Other malaise and fatigue - Plan: CBC with Differential, Comprehensive metabolic panel, Vit D  25 hydroxy (rtn osteoporosis monitoring), TSH  Discussed s/sx of sleep apnea, to check with son/partner,especially if no other cause of her fatigue is found. Suspect Vitamin D levels are low again, due to noncompliance with supplementation. Will contact her with results; she is compliant with and taking thyroid meds appropriately.

## 2013-05-20 NOTE — Patient Instructions (Signed)
We will be in touch with your test results in the next 1-2 days.  If your vitamin D does not come back low, it is still important that you make sure that you are getting [phone] IU every day (multivitamin, or separate vitamin D tablet).  If we find no other reason for your fatigue, ask your partner and son about possible snoring and sleep apnea.

## 2013-05-21 ENCOUNTER — Encounter: Payer: Self-pay | Admitting: Family Medicine

## 2013-05-21 LAB — VITAMIN D 25 HYDROXY (VIT D DEFICIENCY, FRACTURES): Vit D, 25-Hydroxy: 33 ng/mL (ref 30–89)

## 2013-05-22 ENCOUNTER — Other Ambulatory Visit: Payer: Self-pay | Admitting: Family Medicine

## 2013-05-22 DIAGNOSIS — D72829 Elevated white blood cell count, unspecified: Secondary | ICD-10-CM

## 2013-06-17 ENCOUNTER — Encounter: Payer: Self-pay | Admitting: Family Medicine

## 2013-06-17 ENCOUNTER — Ambulatory Visit (INDEPENDENT_AMBULATORY_CARE_PROVIDER_SITE_OTHER): Payer: Medicaid Other | Admitting: Family Medicine

## 2013-06-17 VITALS — BP 128/78 | HR 84 | Ht 65.0 in | Wt 213.0 lb

## 2013-06-17 DIAGNOSIS — M79641 Pain in right hand: Secondary | ICD-10-CM

## 2013-06-17 DIAGNOSIS — R5383 Other fatigue: Secondary | ICD-10-CM

## 2013-06-17 DIAGNOSIS — D72829 Elevated white blood cell count, unspecified: Secondary | ICD-10-CM

## 2013-06-17 DIAGNOSIS — E559 Vitamin D deficiency, unspecified: Secondary | ICD-10-CM

## 2013-06-17 DIAGNOSIS — M7989 Other specified soft tissue disorders: Secondary | ICD-10-CM

## 2013-06-17 DIAGNOSIS — R5381 Other malaise: Secondary | ICD-10-CM

## 2013-06-17 DIAGNOSIS — G5600 Carpal tunnel syndrome, unspecified upper limb: Secondary | ICD-10-CM

## 2013-06-17 DIAGNOSIS — M79642 Pain in left hand: Secondary | ICD-10-CM

## 2013-06-17 DIAGNOSIS — M79609 Pain in unspecified limb: Secondary | ICD-10-CM

## 2013-06-17 DIAGNOSIS — E039 Hypothyroidism, unspecified: Secondary | ICD-10-CM

## 2013-06-17 MED ORDER — NAPROXEN 500 MG PO TABS: 500.0000 mg | ORAL_TABLET | Freq: Two times a day (BID) | ORAL | Status: DC

## 2013-06-17 NOTE — Progress Notes (Signed)
Chief Complaint  Patient presents with  . Edema    B/L hand swelling and pain. Also complains about weight gain, brain fog-would like a complete thyroid panel. Extreme fatigue.    Patient presents with request for thyroid testing (apparently she was asking nurse for referral, never mentioned to me).  She had TSH checked last month, and it was normal.  She is on brand synthroid.  Denies any changes in her diet.  She takes it first thing in the morning, separate from food.  Takes vitamin D at least 3 hours later.  She is due to have repeat CBC today--WBC was 11.8 on routine labs last month, without any symptoms of illness.  She still is not having any fevers, URI symptoms, cough, urinary symptoms, diarrhea, or other symptoms of illness.  She is just complaining of brain fog and significant fatigue.  She has h/o allergies, but they haven't been flaring lately (so hasn't been using Flonase).  She feels like her thyroid is "off".  Even when she gets a good night's sleep, she feels exhausted, having to drink coffee to help with her energy.  Denies snoring or sleep apnea.  Admits that her sleep can be restless.  Denies restless legs/pain in her legs.  She is having pain and swelling in all fingers on both hands, at DIP's and PIP's.  Her hands fall asleep at night.  She doesn't recall exactly which fingers are involved. They do not fall asleep while driving or with any activity. She does a lot of sitting at work.  She has to type some.  Numbness is just at night, not during the day.  Hands have been swollen x 1 week.  Only sometimes do her feet also look puffy. She denies any change in sodium in her diet, reportedly follows a low sodium diet (other than chicken sausage, but this isn't new, and not eaten daily).    Her weight is up 6 pounds since her visit last month.  She is very frustrated regarding her weight, and weight gain.  She is asking about seeing a nutritionist.  She has had endometrial ablation,  still has ovaries, doesn't know when cycles are (asked in regard to weight changes that can happen cyclically)  She is also complaining of brain fog.  She is forgetful, such as losing track of her phone.  Her sleep is "pretty okay".  She gets 7-8 hrs/sleep most nights; wakes up exhausted, not refreshed.  Sometimes she wakes up okay, but is exhausted by the time she gets to work.  She got a new mattress, but knows that she is very restless at night. She falls asleep easily.  She doesn't want to take anything to help with sleep, because it stays in her system too long, makes her too drowsy (ie PM meds)  Past Medical History  Diagnosis Date  . Hypothyroidism age 43  . Impaired fasting glucose     when at 200 pounds  . GERD (gastroesophageal reflux disease)   . Trichomonas vaginalis infection 04/2011  . Allergy   . Chronic cough   . Allergic rhinitis    Past Surgical History  Procedure Laterality Date  . Tubal ligation    . Endometrial ablation  2006 or 2007  . Cesarean section     History   Social History  . Marital Status: Single    Spouse Name: N/A    Number of Children: 2  . Years of Education: N/A   Occupational History  .  CNA [employer]   Social History Main Topics  . Smoking status: Never Smoker   . Smokeless tobacco: Never Used  . Alcohol Use: No  . Drug Use: No  . Sexual Activity: Yes    Partners: Male    Birth Control/ Protection: Condom   Other Topics Concern  . Not on file   Social History Narrative   Lives with son.  Daughter lives in [place], [place]  (has 1 grandson). No pets.  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 cholecalciferol (VITAMIN D) 1000 UNITS tablet Take 1,000 Units by mouth daily.  Marland Kitchen. SYNTHROID 100 MCG tablet Take 1 tablet (100 mcg total) by mouth daily before breakfast.  . fluticasone (FLONASE) 50  MCG/ACT nasal spray Place 2 sprays into the nose daily.  . naproxen (NAPROSYN) 500 MG tablet Take 1 tablet (500 mg total) by mouth 2 (two) times daily with a meal.  (naprosyn added today; NOT using flonase currently)  No Known Allergies  ROS:  +weight gain.  Denies fevers, chills, URI symptoms, shortness of breath, chest pain.  Occasional hot flash.  No bleeding/bruising, rashes, GI complaints or other concerns.  Denies depression.  She has the tingling in her hands, but no headaches or other neuro complaints.  PHYSICAL EXAM: BP 128/78  Pulse 84  Ht 5\' 5"  (1.651 m)  Wt 213 lb (96.616 kg)  BMI 35.44 kg/m2 Well developed, pleasant, obese female in no distress HEENT:  PERRL, conjunctiva clear.  Nasal mucosa without significant edema, erythema Neck: no lymphadenopathy, thyromegaly or mass Heart: regular rate and rhythm Lungs: clear bilaterally Extremities: Some pain at L 3rd DIP.  Mild swelling at a few of the DIP's, not warm, red. Pain along base of thumb/wrist, but nontender in this area.  She has some discomfort with L thumb extension against resistance, normal strength. No pedal edema. +phalen with tingling in bilateral 2nd and 3rd fingers. Negative Tinel.  2+ radial and pedal pulses. Psych:  Normal mood, affect, hygiene and grooming.  Appears somewhat frustrated, but pleasant overall Neuro: alert and oriented.  Cranial nerves intact. Normal strength, sensation.    ASSESSMENT/PLAN:  Leukocytosis - recheck today (11.8 a month ago).  no symptoms of infection, just fatigue - Plan: CBC with Differential  Unspecified hypothyroidism - ongoing fatigue.  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  consider poor sleep quality contributing  Hand swelling - low sodium  diet reviewed.  some may be related to mild arthritic changes  Carpal tunnel syndrome - wear wrist braces at night.  NSAIDs x 1-2 weeks, risks/precautions reviewed. - Plan: naproxen (NAPROSYN) 500 MG tablet  Unspecified vitamin D deficiency - has been WNL on last checks, but at low end.  continue OTC long-term or at risk for recurrent deficiency  Consider sleep study if labs normal.  Re: weight/obesity--advised that insurance generally doesn't cover nutritionist referral without another diagnosis.  She can check to see if her insurance has any kind of wellness plan/coach that could help her

## 2013-06-17 NOTE — Patient Instructions (Signed)
Carpal Tunnel Syndrome The carpal tunnel is a narrow area located on the palm side of your wrist. The tunnel is formed by the wrist bones and ligaments. Nerves, blood vessels, and tendons pass through the carpal tunnel. Repeated wrist motion or certain diseases may cause swelling within the tunnel. This swelling pinches the main nerve in the wrist (median nerve) and causes the painful hand and arm condition called carpal tunnel syndrome. CAUSES   Repeated wrist motions.  Wrist injuries.  Certain diseases like arthritis, diabetes, alcoholism, hyperthyroidism, and kidney failure.  Obesity.  Pregnancy. SYMPTOMS   A "pins and needles" feeling in your fingers or hand.  Tingling or numbness in your fingers or hand.  An aching feeling in your entire arm.  Wrist pain that goes up your arm to your shoulder.  Pain that goes down into your palm or fingers.  A weak feeling in your hands. DIAGNOSIS  Your caregiver will take your history and perform a physical exam. An electromyography test may be needed. This test measures electrical signals sent out by the muscles. The electrical signals are usually slowed by carpal tunnel syndrome. You may also need X-rays. TREATMENT  Carpal tunnel syndrome may clear up by itself. Your caregiver may recommend a wrist splint or medicine such as a nonsteroidal anti-inflammatory medicine. Cortisone injections may help. Sometimes, surgery may be needed to free the pinched nerve.  HOME CARE INSTRUCTIONS   Take all medicine as directed by your caregiver. Only take over-the-counter or prescription medicines for pain, discomfort, or fever as directed by your caregiver.  If you were given a splint to keep your wrist from bending, wear it as directed. It is important to wear the splint at night. Wear the splint for as long as you have pain or numbness in your hand, arm, or wrist. This may take 1 to 2 months.  Rest your wrist from any activity that may be causing your  pain. If your symptoms are work-related, you may need to talk to your employer about changing to a job that does not require using your wrist.  Put ice on your wrist after long periods of wrist activity.  Put ice in a plastic bag.  Place a towel between your skin and the bag.  Leave the ice on for 15-20 minutes, 03-04 times a day.  Keep all follow-up visits as directed by your caregiver. This includes any orthopedic referrals, physical therapy, and rehabilitation. Any delay in getting necessary care could result in a delay or failure of your condition to heal. SEEK IMMEDIATE MEDICAL CARE IF:   You have new, unexplained symptoms.  Your symptoms get worse and are not helped or controlled with medicines. MAKE SURE YOU:   Understand these instructions.  Will watch your condition.  Will get help right away if you are not doing well or get worse. Document Released: 01/20/2000 Document Revised: 04/16/2011 Document Reviewed: 12/08/2010 [facility] Patient Information 2014 [place], [place].   Wear wrist braces to sleep at night.  Take naproxen twice daily with food.  Do not use any other anti-inflammatories. You may use tylenol if needed for pain

## 2013-06-18 ENCOUNTER — Other Ambulatory Visit: Payer: Self-pay | Admitting: *Deleted

## 2013-06-18 DIAGNOSIS — R5383 Other fatigue: Principal | ICD-10-CM

## 2013-06-18 DIAGNOSIS — R5381 Other malaise: Secondary | ICD-10-CM

## 2013-06-18 DIAGNOSIS — R400 Somnolence: Secondary | ICD-10-CM

## 2013-06-18 LAB — CBC WITH DIFFERENTIAL/PLATELET
BASOS ABS: 0 10*3/uL (ref 0.0–0.1)
Basophils Relative: 0 % (ref 0–1)
EOS ABS: 0.4 10*3/uL (ref 0.0–0.7)
EOS PCT: 3 % (ref 0–5)
HCT: 38.1 % (ref 36.0–46.0)
Hemoglobin: 12.7 g/dL (ref 12.0–15.0)
Lymphocytes Relative: 24 % (ref 12–46)
Lymphs Abs: 2.9 10*3/uL (ref 0.7–4.0)
MCH: 27.3 pg (ref 26.0–34.0)
MCHC: 33.3 g/dL (ref 30.0–36.0)
MCV: 81.9 fL (ref 78.0–100.0)
Monocytes Absolute: 0.5 10*3/uL (ref 0.1–1.0)
Monocytes Relative: 4 % (ref 3–12)
Neutro Abs: 8.2 10*3/uL — ABNORMAL HIGH (ref 1.7–7.7)
Neutrophils Relative %: 69 % (ref 43–77)
PLATELETS: 307 10*3/uL (ref 150–400)
RBC: 4.65 MIL/uL (ref 3.87–5.11)
RDW: 14.6 % (ref 11.5–15.5)
WBC: 11.9 10*3/uL — ABNORMAL HIGH (ref 4.0–10.5)

## 2013-06-18 LAB — SEDIMENTATION RATE: SED RATE: 1 mm/h (ref 0–22)

## 2013-06-18 LAB — TSH: TSH: 1.778 u[IU]/mL (ref 0.350–4.500)

## 2013-06-18 LAB — T3, FREE: T3, Free: 2.6 pg/mL (ref 2.3–4.2)

## 2013-06-18 LAB — T4, FREE: Free T4: 1.28 ng/dL (ref 0.80–1.80)

## 2013-07-08 ENCOUNTER — Telehealth: Payer: Self-pay | Admitting: Family Medicine

## 2013-07-08 ENCOUNTER — Other Ambulatory Visit: Payer: Self-pay | Admitting: Family Medicine

## 2013-07-08 NOTE — Telephone Encounter (Signed)
Advised pt RF should be at pharmacy

## 2013-08-02 ENCOUNTER — Ambulatory Visit (HOSPITAL_BASED_OUTPATIENT_CLINIC_OR_DEPARTMENT_OTHER): Payer: Medicaid Other | Attending: Family Medicine

## 2013-08-21 ENCOUNTER — Encounter: Payer: Self-pay | Admitting: Medical

## 2013-08-21 ENCOUNTER — Other Ambulatory Visit: Payer: Self-pay | Admitting: Family Medicine

## 2013-08-21 ENCOUNTER — Ambulatory Visit (INDEPENDENT_AMBULATORY_CARE_PROVIDER_SITE_OTHER): Payer: Medicaid Other | Admitting: Medical

## 2013-08-21 VITALS — BP 100/70 | HR 80 | Temp 98.0°F | Resp 14 | Wt 207.0 lb

## 2013-08-21 DIAGNOSIS — R05 Cough: Secondary | ICD-10-CM

## 2013-08-21 DIAGNOSIS — J069 Acute upper respiratory infection, unspecified: Secondary | ICD-10-CM

## 2013-08-21 DIAGNOSIS — J329 Chronic sinusitis, unspecified: Secondary | ICD-10-CM

## 2013-08-21 DIAGNOSIS — R0982 Postnasal drip: Secondary | ICD-10-CM

## 2013-08-21 DIAGNOSIS — R053 Chronic cough: Secondary | ICD-10-CM

## 2013-08-21 DIAGNOSIS — K219 Gastro-esophageal reflux disease without esophagitis: Secondary | ICD-10-CM

## 2013-08-21 DIAGNOSIS — R059 Cough, unspecified: Secondary | ICD-10-CM

## 2013-08-21 MED ORDER — FAMOTIDINE 20 MG PO TABS: 20.0000 mg | ORAL_TABLET | Freq: Every day | ORAL | Status: DC

## 2013-08-21 MED ORDER — GUAIFENESIN-CODEINE 100-10 MG/5ML PO SYRP: 5.0000 mL | ORAL_SOLUTION | Freq: Three times a day (TID) | ORAL | Status: DC | PRN

## 2013-08-21 MED ORDER — OMEPRAZOLE 40 MG PO CPDR: DELAYED_RELEASE_CAPSULE | ORAL | Status: DC

## 2013-08-21 NOTE — Progress Notes (Signed)
   Subjective:    Patient ID: Gail Bartlett, female    DOB: 1970/07/10, 43 y.o.   MRN: [id number]  HPI  Patient is presenting for chest congestion and general malaise for the past 5 days. Associated symptoms include a morning productive cough, yellow-green phlegm, runny nose, sore/raw throat, "pressure in her head", ear pressure. She reports that she had a bad coughing fit causing her to throw up Monday night. Vomited again Tuesday night. She has tried drinking warm water with lemon juice, honey, Mucinex DM with some relief. She has also used Flonase for the past 2 days without any significant changes. Reports every year she has congestion in her chest right around this time. Non-smoker, no second hand smoke, not drinking alcohol. She saw ENT last year and was started on Dexliant which she recently started back up this week since she felt sick. Denies fevers, vomiting since Tuesday, hematemesis, itchy watery eyes, difficulty swallowing, sob, wheezing, difficulty breathing, chest pain, abdominal pain, urinary changes, flank pain, diarrhea. 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 No other aggravating or relieving factors.  Review of Systems As in subjective.    Objective:   Physical Exam  BP 100/70  Pulse 80  Temp(Src) 98 F (36.7 C) (Oral)  Resp 14  Wt 207 lb (93.895 kg)   General appearance: alert, no distress, WD/WN, mildly ill appearing HEENT: normocephalic, sclerae anicteric, conjunctiva pink and moist, TMs pearly, nares patent, no discharge or erythema, pharynx with mild erythema, tonsils unremarkable Oral cavity: MMM, no lesions Neck: supple, no lymphadenopathy, no thyromegaly, no masses Heart: RRR, normal S1, S2, no murmurs Lungs: CTA bilaterally, no wheezes, rhonchi, or rales Pulses: 2+ symmetric        Assessment & Plan:   Encounter Diagnoses  Name Primary?  Marland Kitchen. Upper respiratory infection Yes  . Chronic cough   . Gastroesophageal reflux  disease without esophagitis   . Post-nasal drainage    We discussed her concerns and symptoms, and also look back over prior records including pulmonology notes given her chronic cough concerns  Specific recommendations today include:  for your current cough and congestion symptoms, continue Mucinex DM through the weekend, increase your water intake, and you may use the Cheratussin cough syrup as needed for worse cough  Begin omeprazole 1 tablet 45 minutes prior to breakfast daily as well as famotidine tablet at bedtime for chronic cough  Continue your Flonase nasal  Avoid acidic and spicy foods  Let us see how this helps to chronic cough  Patient was seen in conjunction with PA student Wallis BambergMario Mani, and I have also evaluated and examined patient, agree with student's notes, student supervised by me.

## 2013-08-21 NOTE — Patient Instructions (Signed)
  Thank you for giving me the opportunity to serve you today.    Your diagnosis today includes: Encounter Diagnoses  Name Primary?  Gail Bartlett. Upper respiratory infection Yes  . Chronic cough   . Gastroesophageal reflux disease without esophagitis   . Post-nasal drainage      Specific recommendations today include:  for your current cough and congestion symptoms, continue Mucinex DM through the weekend, increase your water intake, and you may use the Cheratussin cough syrup as needed for worse cough  Begin omeprazole 1 tablet 45 minutes prior to breakfast daily as well as famotidine tablet at bedtime for chronic cough  Continue your Flonase nasal  Avoid acidic and spicy foods  Let us see how this helps to chronic cough  Return if symptoms worsen or fail to improve.    I have included other useful information below for your review.  Cough, Adult  A cough is a reflex that helps clear your throat and airways. It can help heal the body or may be a reaction to an irritated airway. A cough may only last 2 or 3 weeks (acute) or may last more than 8 weeks (chronic).  CAUSES Acute cough:  Viral or bacterial infections. Chronic cough:  Infections.  Allergies.  Asthma.  Post-nasal drip.  Smoking.  Heartburn or acid reflux.  Some medicines.  Chronic lung problems (COPD).  Cancer. SYMPTOMS   Cough.  Fever.  Chest pain.  Increased breathing rate.  High-pitched whistling sound when breathing (wheezing).  Colored mucus that you cough up (sputum). TREATMENT   A bacterial cough may be treated with antibiotic medicine.  A viral cough must run its course and will not respond to antibiotics.  Your caregiver may recommend other treatments if you have a chronic cough. HOME CARE INSTRUCTIONS   Only take over-the-counter or prescription medicines for pain, discomfort, or fever as directed by your caregiver. Use cough suppressants only as directed by your caregiver.  Use  a cold steam vaporizer or humidifier in your bedroom or home to help loosen secretions.  Sleep in a semi-upright position if your cough is worse at night.  Rest as needed.  Stop smoking if you smoke. SEEK IMMEDIATE MEDICAL CARE IF:   You have pus in your sputum.  Your cough starts to worsen.  You cannot control your cough with suppressants and are losing sleep.  You begin coughing up blood.  You have difficulty breathing.  You develop pain which is getting worse or is uncontrolled with medicine.  You have a fever. MAKE SURE YOU:   Understand these instructions.  Will watch your condition.  Will get help right away if you are not doing well or get worse. Document Released: 07/21/2010 Document Revised: 04/16/2011 Document Reviewed: 07/21/2010 [facility] Patient Information 2015 [place], [place]. This information is not intended to replace advice given to you by your health care provider. Make sure you discuss any questions you have with your health care provider.

## 2013-10-06 ENCOUNTER — Other Ambulatory Visit: Payer: Self-pay | Admitting: Family Medicine

## 2013-10-19 ENCOUNTER — Encounter: Payer: Medicaid Other | Admitting: Medical

## 2013-11-06 ENCOUNTER — Telehealth: Payer: Self-pay | Admitting: Medical

## 2013-11-06 ENCOUNTER — Other Ambulatory Visit (HOSPITAL_COMMUNITY)
Admission: RE | Admit: 2013-11-06 | Discharge: 2013-11-06 | Disposition: A | Discharge status: Home / Self Care | Payer: BC Managed Care – PPO | Source: Ambulatory Visit | Attending: Medical | Admitting: Medical

## 2013-11-06 ENCOUNTER — Encounter: Payer: Self-pay | Admitting: Medical

## 2013-11-06 ENCOUNTER — Ambulatory Visit (INDEPENDENT_AMBULATORY_CARE_PROVIDER_SITE_OTHER): Payer: BC Managed Care – PPO | Admitting: Medical

## 2013-11-06 VITALS — BP 98/60 | HR 68 | Temp 97.5°F | Resp 16 | Ht 64.2 in | Wt 203.0 lb

## 2013-11-06 DIAGNOSIS — Z1239 Encounter for other screening for malignant neoplasm of breast: Secondary | ICD-10-CM

## 2013-11-06 DIAGNOSIS — Z0000 Encounter for general adult medical examination without abnormal findings: Secondary | ICD-10-CM

## 2013-11-06 DIAGNOSIS — R5382 Chronic fatigue, unspecified: Secondary | ICD-10-CM | POA: Insufficient documentation

## 2013-11-06 DIAGNOSIS — K219 Gastro-esophageal reflux disease without esophagitis: Secondary | ICD-10-CM

## 2013-11-06 DIAGNOSIS — E669 Obesity, unspecified: Secondary | ICD-10-CM

## 2013-11-06 DIAGNOSIS — Z1151 Encounter for screening for human papillomavirus (HPV): Secondary | ICD-10-CM | POA: Diagnosis present

## 2013-11-06 DIAGNOSIS — Z124 Encounter for screening for malignant neoplasm of cervix: Secondary | ICD-10-CM

## 2013-11-06 DIAGNOSIS — Z01419 Encounter for gynecological examination (general) (routine) without abnormal findings: Secondary | ICD-10-CM | POA: Insufficient documentation

## 2013-11-06 DIAGNOSIS — R748 Abnormal levels of other serum enzymes: Secondary | ICD-10-CM

## 2013-11-06 DIAGNOSIS — R05 Cough: Secondary | ICD-10-CM | POA: Insufficient documentation

## 2013-11-06 DIAGNOSIS — N63 Unspecified lump in breast: Secondary | ICD-10-CM

## 2013-11-06 DIAGNOSIS — Z113 Encounter for screening for infections with a predominantly sexual mode of transmission: Secondary | ICD-10-CM | POA: Diagnosis present

## 2013-11-06 DIAGNOSIS — N6313 Unspecified lump in the right breast, lower outer quadrant: Secondary | ICD-10-CM

## 2013-11-06 DIAGNOSIS — R053 Chronic cough: Secondary | ICD-10-CM

## 2013-11-06 DIAGNOSIS — E039 Hypothyroidism, unspecified: Secondary | ICD-10-CM | POA: Insufficient documentation

## 2013-11-06 DIAGNOSIS — R7301 Impaired fasting glucose: Secondary | ICD-10-CM | POA: Insufficient documentation

## 2013-11-06 LAB — HIV ANTIBODY (ROUTINE TESTING W REFLEX): HIV 1&2 Ab, 4th Generation: NONREACTIVE

## 2013-11-06 LAB — RPR

## 2013-11-06 LAB — POCT URINALYSIS DIPSTICK
BILIRUBIN UA: NEGATIVE
GLUCOSE UA: NEGATIVE
Ketones, UA: NEGATIVE
Leukocytes, UA: NEGATIVE
NITRITE UA: NEGATIVE
RBC UA: NEGATIVE
Spec Grav, UA: 1.02
Urobilinogen, UA: NEGATIVE
pH, UA: 5

## 2013-11-06 LAB — CBC
HCT: 38.3 % (ref 36.0–46.0)
HEMOGLOBIN: 12.9 g/dL (ref 12.0–15.0)
MCH: 26.9 pg (ref 26.0–34.0)
MCHC: 33.7 g/dL (ref 30.0–36.0)
MCV: 79.8 fL (ref 78.0–100.0)
Platelets: 323 10*3/uL (ref 150–400)
RBC: 4.8 MIL/uL (ref 3.87–5.11)
RDW: 14.3 % (ref 11.5–15.5)
WBC: 11.3 10*3/uL — ABNORMAL HIGH (ref 4.0–10.5)

## 2013-11-06 LAB — HEPATIC FUNCTION PANEL
ALBUMIN: 4.1 g/dL (ref 3.5–5.2)
ALK PHOS: 86 U/L (ref 39–117)
ALT: 21 U/L (ref 0–35)
AST: 28 U/L (ref 0–37)
BILIRUBIN INDIRECT: 0.3 mg/dL (ref 0.2–1.2)
Bilirubin, Direct: 0.1 mg/dL (ref 0.0–0.3)
TOTAL PROTEIN: 6.8 g/dL (ref 6.0–8.3)
Total Bilirubin: 0.4 mg/dL (ref 0.2–1.2)

## 2013-11-06 LAB — TSH: TSH: 3.112 u[IU]/mL (ref 0.350–4.500)

## 2013-11-06 NOTE — Telephone Encounter (Signed)
Set up for diagnostic right mammogram and screening left mammogram.   Lump noted right breast 7 o'clock position.

## 2013-11-06 NOTE — Patient Instructions (Signed)

## 2013-11-06 NOTE — Progress Notes (Signed)
Subjective:   HPI  Gail Bartlett is a 43 y.o. female who presents for a complete physical.   Preventative care: Last ophthalmology visit:n/a Last dental visit:looking for a dentist Last colonoscopy:n/a Last mammogram:08/2012 Last gynecological exam:today Last EKG:n/a Last labs:06/2013  Prior vaccinations: TD or Tdap:2014 Influenza:2015 at work Pneumococcal:n/a Shingles/Zostavax:n/a  Advanced directive:n/a Health care power of attorney:n/a Living will:n/a  Concerns: Still having fatigue - she does work full time, long shifts, in school part time [employer] for business BA program, but studies all the time, so like in school full time.  She does try to eat healthy and exercise regularly.   Compliant with her thyroid medication.  She hasn't had a period since 2007 after ablation for heavy bleeding.    Obesity - can't seem to lose weight  concentrations issues at school, forgetful, can't seem to get class work at times.     Reviewed their medical, surgical, family, social, medication, and allergy history and updated chart as appropriate.  Past Medical History  Diagnosis Date  . Impaired fasting glucose     when at 200 pounds  . GERD (gastroesophageal reflux disease)   . Trichomonas vaginalis infection 04/2011  . Chronic cough   . Allergic rhinitis   . Obesity   . Amenorrhea     since ablation in 2007  . Hypothyroidism age 52    Past Surgical History  Procedure Laterality Date  . Tubal ligation    . Endometrial ablation  2006 or 2007  . Cesarean section      History   Social History  . Marital Status: Single    Spouse Name: N/A    Number of Children: 2  . Years of Education: N/A   Occupational History  . CNA [employer]   Social History Main Topics  . Smoking status: Never Smoker   . Smokeless tobacco: Never Used  . Alcohol Use: No  . Drug Use: No  . Sexual Activity: Yes    Partners: Male    Birth Control/ Protection: Condom   Other Topics  Concern  . Not on file   Social History Narrative   Lives with son.  Daughter lives in [place], [place]  (has 1 grandson). No pets.  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  Exercise - walks, goes to the gym sometimes.  Diet- avoids junk food, tries to eat healthy, small portions.      Family History  Problem Relation Age of Onset  . Hypertension Mother   . Anemia Mother     needed blood tranfusions  . Brain cancer Father   . Schizophrenia Father     paranoid  . Gout Father   . Diabetes Brother   . ADD / ADHD Daughter   . ADD / ADHD Son   . Lung cancer Maternal Grandmother   . Cancer Maternal Grandmother   . Colon cancer Neg Hx   . Breast cancer Neg Hx     Current outpatient prescriptions:famotidine (PEPCID) 20 MG tablet, Take 1 tablet (20 mg total) by mouth at bedtime., Disp: 30 tablet, Rfl: 1;  fluticasone (FLONASE) 50 MCG/ACT nasal spray, USE TWO SPRAYS INTO THE NOSE DAILY., Disp: 16 g, Rfl: 0;  naproxen (NAPROSYN) 500 MG tablet, Take 1 tablet (500 mg total) by mouth 2 (two) times daily with a meal., Disp: 30 tablet, Rfl: 0 omeprazole (PRILOSEC) 40 MG capsule, 1 tablet 45 min prior to breakfast, Disp: 30 capsule, Rfl: 1;  SYNTHROID 100 MCG tablet, Take 1 tablet (100 mcg total) by mouth daily before breakfast., Disp: 30 tablet, Rfl: 5 Current facility-administered medications:measles, mumps and rubella vaccine (MMR) injection 0.5 mL, 0.5 mL, Subcutaneous, Once, Denita Lung, MD  No Known Allergies    Review of Systems Constitutional: -fever, -chills, -sweats, +unexpected weight change, -decreased appetite, -fatigue Allergy: -sneezing, -itching, -congestion Dermatology: -changing moles, --rash, -lumps ENT: -runny nose, -ear pain, -sore throat, -hoarseness, -sinus pain, -teeth pain, - ringing in ears, -hearing loss, -nosebleeds Cardiology: -chest pain, -palpitations, -swelling, -difficulty breathing when lying flat, -waking up short of  breath Respiratory: -cough, -shortness of breath, -difficulty breathing with exercise or exertion, -wheezing, -coughing up blood Gastroenterology: -abdominal pain, -nausea, -vomiting, -diarrhea, -constipation, -blood in stool, -changes in bowel movement, -difficulty swallowing or eating Hematology: -bleeding, -bruising  Musculoskeletal: -joint aches, -muscle aches, -joint swelling, -back pain, -neck pain, -cramping, -changes in gait Ophthalmology: denies vision changes, eye redness, itching, discharge Urology: -burning with urination, -difficulty urinating, -blood in urine, -urinary frequency, -urgency, -incontinence Neurology: -headache, -weakness, -tingling, +numbness, -memory loss, -falls, -dizziness Psychology: -depressed mood, -agitation, -sleep problems,+retaining/attention issues     Objective:   Physical Exam  BP 98/60  Pulse 68  Temp(Src) 97.5 F (36.4 C) (Oral)  Resp 16  Ht 5' 4.2" (1.631 m)  Wt 203 lb (92.08 kg)  BMI 34.61 kg/m2  General appearance: alert, no distress, WD/WN, AA female Skin: scattered macules, no worrisome lesions HEENT: normocephalic, conjunctiva/corneas normal, sclerae anicteric, PERRLA, EOMi, nares patent, no discharge or erythema, pharynx normal Oral cavity: MMM, tongue normal, teeth in good repair Neck: supple, no lymphadenopathy, no thyromegaly, no masses, normal ROM, no bruits Chest: non tender, normal shape and expansion Heart: RRR, normal S1, S2, no murmurs Lungs: CTA bilaterally, no wheezes, rhonchi, or rales Abdomen: +bs, soft, lower horizontal scar from prior C sectoin, non tender, non distended, no masses, no hepatomegaly, no splenomegaly, no bruits Back: non tender, normal ROM, no scoliosis Musculoskeletal: upper extremities non tender, no obvious deformity, normal ROM throughout, lower extremities non tender, no obvious deformity, normal ROM throughout Extremities: no edema, no cyanosis, no clubbing Pulses: 2+ symmetric, upper and lower  extremities, normal cap refill Neurological: alert, oriented x 3, CN2-12 intact, strength normal upper extremities and lower extremities, sensation normal throughout, DTRs 2+ throughout, no cerebellar signs, gait normal Psychiatric: normal affect, behavior normal, pleasant  Breast: nontender, diffuse fibrocystic changes but there is a subtle mass <1cm somewhat nonmobile dense at very lateral 7 o'clock position of right breast, otherwise no other concerning masses or lumps, no skin changes, no nipple discharge or inversion, no axillary lymphadenopathy Gyn: Normal external genitalia without lesions, vagina with normal mucosa, cervix without lesions, cervix is tilted, no cervical motion tenderness, no abnormal vaginal discharge.  Uterus and adnexa not enlarged, nontender, no masses.  Pap performed.  Exam chaperoned by nurse. Rectal: anus normal appearing    Assessment and Plan :    Encounter Diagnoses  Name Primary?  . Encounter for health maintenance examination in adult Yes  . Screening for cervical cancer   . Screen for STD (sexually transmitted disease)   . Impaired fasting blood sugar   . Obesity   . Chronic fatigue   . Hypothyroidism, unspecified hypothyroidism type   . Chronic cough   . Gastroesophageal reflux disease, esophagitis presence not specified   . Breast lump on right side at 7 o'clock position   . Screening for breast cancer   . Alkaline phosphatase elevation  Physical exam - discussed healthy lifestyle, diet, exercise, preventative care, vaccinations, and addressed their concerns.  Handout given.  Pap sent, STD screening, c/t to use condoms.  Of note, trichomonas noted 2013 pap, was went a different sexual partner  imapired glucose, obesity - advised exercise more days per week, eat healthy diet, c/t efforts to lose weight, f/u pending labs.  Chronic fatigue - pending labs.  consider sleep study recommended by Dr. Tomi Bamberger. She never pursued this.    Hypothyroidism  - c/t current medication, reviewed recent labs  Chronic cough, GERD - c/t same medication, cough much improved since recently added GERD regimen  Breast lump on exam today - referral for mammogram  Elevated Alkaline phosphatase on recent lab.  Recheck hepatic panel today.   Return pending labs.

## 2013-11-07 LAB — FSH/LH
FSH: 6 m[IU]/mL
LH: 4.4 m[IU]/mL

## 2013-11-07 LAB — PROLACTIN: Prolactin: 7.5 ng/mL

## 2013-11-07 LAB — HEMOGLOBIN A1C
HEMOGLOBIN A1C: 6.2 % — AB (ref <5.7)
MEAN PLASMA GLUCOSE: 131 mg/dL — AB (ref <117)

## 2013-11-09 ENCOUNTER — Other Ambulatory Visit: Payer: Self-pay | Admitting: Family Medicine

## 2013-11-09 ENCOUNTER — Telehealth: Payer: Self-pay | Admitting: Family Medicine

## 2013-11-09 ENCOUNTER — Other Ambulatory Visit: Payer: Self-pay | Admitting: Medical

## 2013-11-09 DIAGNOSIS — Z1239 Encounter for other screening for malignant neoplasm of breast: Secondary | ICD-10-CM

## 2013-11-09 DIAGNOSIS — N6313 Unspecified lump in the right breast, lower outer quadrant: Secondary | ICD-10-CM

## 2013-11-09 MED ORDER — SYNTHROID 100 MCG PO TABS
100.0000 ug | ORAL_TABLET | Freq: Every day | ORAL | Status: DC
Start: 2013-11-09 — End: 2013-12-17

## 2013-11-09 NOTE — Telephone Encounter (Signed)
Patient is aware of her mammogram appointment. CLS

## 2013-11-09 NOTE — Telephone Encounter (Signed)
[facility] req refill for Synthroid 100 MCG tab # 30   Pt out of meds and wants more than one refill

## 2013-11-09 NOTE — Telephone Encounter (Signed)
Rx refills sent to her pharmacy 

## 2013-11-10 LAB — CYTOLOGY - PAP

## 2013-11-11 ENCOUNTER — Encounter: Payer: Self-pay | Admitting: Family Medicine

## 2013-11-11 ENCOUNTER — Other Ambulatory Visit: Payer: Self-pay | Admitting: Medical

## 2013-11-11 ENCOUNTER — Other Ambulatory Visit: Payer: Self-pay

## 2013-11-11 DIAGNOSIS — N6313 Unspecified lump in the right breast, lower outer quadrant: Secondary | ICD-10-CM

## 2013-11-12 ENCOUNTER — Ambulatory Visit
Admission: RE | Admit: 2013-11-12 | Discharge: 2013-11-12 | Disposition: A | Discharge status: Home / Self Care | Payer: BC Managed Care – PPO | Source: Ambulatory Visit | Attending: Medical | Admitting: Medical

## 2013-11-12 DIAGNOSIS — N6313 Unspecified lump in the right breast, lower outer quadrant: Secondary | ICD-10-CM

## 2013-11-13 ENCOUNTER — Other Ambulatory Visit: Payer: Self-pay | Admitting: Medical

## 2013-11-13 DIAGNOSIS — N6313 Unspecified lump in the right breast, lower outer quadrant: Secondary | ICD-10-CM

## 2013-11-16 ENCOUNTER — Encounter: Payer: Self-pay | Admitting: Family Medicine

## 2013-12-07 ENCOUNTER — Encounter: Payer: Self-pay | Admitting: Medical

## 2013-12-14 ENCOUNTER — Telehealth: Payer: Self-pay | Admitting: Internal Medicine

## 2013-12-14 ENCOUNTER — Other Ambulatory Visit: Payer: Self-pay | Admitting: Medical

## 2013-12-14 MED ORDER — OMEPRAZOLE 40 MG PO CPDR: DELAYED_RELEASE_CAPSULE | ORAL | Status: DC

## 2013-12-14 MED ORDER — FAMOTIDINE 20 MG PO TABS: 20.0000 mg | ORAL_TABLET | Freq: Every day | ORAL | Status: DC

## 2013-12-14 NOTE — Telephone Encounter (Signed)
done

## 2013-12-14 NOTE — Telephone Encounter (Signed)
Refill request for omeprazole Dr 40mg  #30,Famotidine 20mg  #30 to [facility] [address]

## 2013-12-17 ENCOUNTER — Telehealth: Payer: Self-pay | Admitting: Internal Medicine

## 2013-12-17 MED ORDER — SYNTHROID 100 MCG PO TABS: 100.0000 ug | ORAL_TABLET | Freq: Every day | ORAL | Status: DC

## 2013-12-17 NOTE — Telephone Encounter (Signed)
Done

## 2013-12-17 NOTE — Telephone Encounter (Signed)
[facility]  

## 2013-12-21 ENCOUNTER — Telehealth: Payer: Self-pay | Admitting: Medical

## 2013-12-21 MED ORDER — OMEPRAZOLE 40 MG PO CPDR: DELAYED_RELEASE_CAPSULE | ORAL | Status: DC

## 2013-12-21 MED ORDER — FAMOTIDINE 20 MG PO TABS: 20.0000 mg | ORAL_TABLET | Freq: Every day | ORAL | Status: DC

## 2013-12-21 NOTE — Telephone Encounter (Signed)
Refilled med #90 with 1 refill

## 2014-03-03 ENCOUNTER — Telehealth: Payer: Self-pay | Admitting: Internal Medicine

## 2014-03-03 MED ORDER — SYNTHROID 100 MCG PO TABS: 100.0000 ug | ORAL_TABLET | Freq: Every day | ORAL | Status: DC

## 2014-03-03 NOTE — Telephone Encounter (Signed)
Done

## 2014-03-03 NOTE — Telephone Encounter (Signed)
[facility]

## 2014-03-31 ENCOUNTER — Ambulatory Visit (INDEPENDENT_AMBULATORY_CARE_PROVIDER_SITE_OTHER): Payer: BC Managed Care – PPO | Admitting: Medical

## 2014-03-31 ENCOUNTER — Encounter: Payer: Self-pay | Admitting: Medical

## 2014-03-31 VITALS — BP 110/80 | HR 86 | Temp 98.2°F | Resp 16 | Wt 199.0 lb

## 2014-03-31 DIAGNOSIS — R5382 Chronic fatigue, unspecified: Secondary | ICD-10-CM

## 2014-03-31 DIAGNOSIS — E039 Hypothyroidism, unspecified: Secondary | ICD-10-CM

## 2014-03-31 DIAGNOSIS — D72829 Elevated white blood cell count, unspecified: Secondary | ICD-10-CM

## 2014-03-31 LAB — CBC
HCT: 42.2 % (ref 36.0–46.0)
HEMOGLOBIN: 13.6 g/dL (ref 12.0–15.0)
MCH: 26.8 pg (ref 26.0–34.0)
MCHC: 32.2 g/dL (ref 30.0–36.0)
MCV: 83.1 fL (ref 78.0–100.0)
MPV: 9.4 fL (ref 8.6–12.4)
Platelets: 320 10*3/uL (ref 150–400)
RBC: 5.08 MIL/uL (ref 3.87–5.11)
RDW: 14.6 % (ref 11.5–15.5)
WBC: 9.9 10*3/uL (ref 4.0–10.5)
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
## 2014-04-01 ENCOUNTER — Other Ambulatory Visit: Payer: Self-pay | Admitting: Medical

## 2014-04-01 LAB — TSH: TSH: 1.4 u[IU]/mL (ref 0.350–4.500)

## 2014-04-01 LAB — T4, FREE: Free T4: 1.18 ng/dL (ref 0.80–1.80)

## 2014-04-01 MED ORDER — METHYLPHENIDATE HCL 10 MG PO TABS: 10.0000 mg | ORAL_TABLET | Freq: Two times a day (BID) | ORAL | Status: DC

## 2014-04-09 ENCOUNTER — Telehealth: Payer: Self-pay | Admitting: Medical

## 2014-04-09 NOTE — Telephone Encounter (Signed)
Pt called and requested [facility]

## 2014-04-12 ENCOUNTER — Other Ambulatory Visit: Payer: Self-pay | Admitting: Family Medicine

## 2014-04-12 DIAGNOSIS — G5600 Carpal tunnel syndrome, unspecified upper limb: Secondary | ICD-10-CM

## 2014-04-12 NOTE — Telephone Encounter (Signed)
Remind me why she takes Naprosyn, I don't see a specific diagnosis for this

## 2014-04-13 ENCOUNTER — Other Ambulatory Visit: Payer: Self-pay | Admitting: Medical

## 2014-04-13 MED ORDER — NAPROXEN 500 MG PO TABS: 500.0000 mg | ORAL_TABLET | Freq: Two times a day (BID) | ORAL | Status: DC

## 2014-04-13 NOTE — Telephone Encounter (Signed)
Patient is aware that her medication was sent and she is also aware of Kristian CoveyShane Tysinger PA message and recommendations

## 2014-04-13 NOTE — Telephone Encounter (Signed)
It was because her hands swell and this helps with the inflammation

## 2014-04-13 NOTE — Telephone Encounter (Signed)
i sent the medication, but if she has hand swelling and wrist swelling regularly, need to come in and discuss.  May need additional evaluation.  I don't show wrist or hand xrays either.

## 2014-04-26 ENCOUNTER — Telehealth: Payer: Self-pay | Admitting: Medical

## 2014-04-26 NOTE — Telephone Encounter (Signed)
Since we started a new stimulant medication, needs OV f/u and ask her to have her counselor call me in the next few days to touch base.

## 2014-04-26 NOTE — Telephone Encounter (Signed)
Please call   Re Rx you gave her for fatigue , it is helping but she is almost out. Please call and let her know if you want her to continue

## 2014-04-27 NOTE — Telephone Encounter (Signed)
Patient is aware that she will need to have her councilors contact Kristian CoveyShane TYsinger PA and she also needs an appointment.

## 2014-05-04 ENCOUNTER — Ambulatory Visit (INDEPENDENT_AMBULATORY_CARE_PROVIDER_SITE_OTHER): Payer: BC Managed Care – PPO | Admitting: Medical

## 2014-05-04 ENCOUNTER — Encounter: Payer: Self-pay | Admitting: Medical

## 2014-05-04 ENCOUNTER — Telehealth: Payer: Self-pay | Admitting: Medical

## 2014-05-04 VITALS — BP 112/80 | HR 87 | Resp 15 | Wt 205.0 lb

## 2014-05-04 DIAGNOSIS — R5382 Chronic fatigue, unspecified: Secondary | ICD-10-CM

## 2014-05-04 DIAGNOSIS — M25561 Pain in right knee: Secondary | ICD-10-CM | POA: Diagnosis not present

## 2014-05-04 DIAGNOSIS — F909 Attention-deficit hyperactivity disorder, unspecified type: Secondary | ICD-10-CM | POA: Diagnosis not present

## 2014-05-04 DIAGNOSIS — F988 Other specified behavioral and emotional disorders with onset usually occurring in childhood and adolescence: Secondary | ICD-10-CM

## 2014-05-04 MED ORDER — METHYLPHENIDATE HCL 20 MG PO TABS: 20.0000 mg | ORAL_TABLET | Freq: Two times a day (BID) | ORAL | Status: DC

## 2014-05-04 NOTE — Patient Instructions (Signed)
Patellofemoral Syndrome If you have had pain in the front of your knee for a long time, chances are good that you have patellofemoral syndrome. The word patella refers to the kneecap. Femoral (or femur) refers to the thigh bone. That is the bone the kneecap sits on. The kneecap is shaped like a triangle. Its job is to protect the knee and to improve the efficiency of your thigh muscles (quadriceps). The underside of the kneecap is made of smooth tissue (cartilage). This lets the kneecap slide up and down as the knee moves. Sometimes this cartilage becomes soft. Your healthcare provider may say the cartilage breaks down. That is patellofemoral syndrome. It can affect one knee, or both. The condition is sometimes called patellofemoral pain syndrome. That is because the condition is painful. The pain usually gets worse with activity. Sitting for a long time with the knee bent also makes the pain worse. It usually gets better with rest and proper treatment. CAUSES  No one is sure why some people develop this problem and others do not. Runners often get it. One name for the condition is "runner's knee." However, some people run for years and never have knee pain. Certain things seem to make patellofemoral syndrome more likely. They include:  Moving out of alignment. The kneecap is supposed to move in a straight line when the thigh muscle pulls on it. Sometimes the kneecap moves in poor alignment. That can make the knee swell and hurt. Some experts believe it also wears down the cartilage.  Injury to the kneecap.  Strain on the knee. This may occur during sports activity. Soccer, running, skiing and cycling can put excess stress on the knee.  Being flat-footed or knock-kneed. SYMPTOMS   Knee pain.  Pain under the kneecap. This is usually a dull, aching pain.  Pain in the knee when doing certain things: squatting, kneeling, going up or down stairs.  Pain in the knee when you stand up after sitting down  for awhile.  Tightness in the knee.  Loss of muscle strength in the thigh.  Swelling of the knee. DIAGNOSIS  Healthcare providers often send people with knee pain to an orthopedic caregiver. This person has special training to treat problems with bones and joints. To decide what is causing your knee pain, your caregiver will probably:  Do a physical exam. This will probably include:  Asking about symptoms you have noticed.  Asking about your activities and any injuries.  Feeling your knee. Moving it. This will help test the knee's strength. It will also check alignment (whether the knee and leg are aligned normally).  Order some tests, such as:  Imaging tests. They create pictures of the inside of the knee. Tests may include:  X-rays.  Computed tomography (CT) scan. This uses X-rays and a computer to show more detail.  Magnetic resonance imaging (MRI). This test uses magnets, radio waves and a computer to make pictures. TREATMENT   Medication is almost always used first. It can relieve pain. It also can reduce swelling. Non-steroidal anti-inflammatory medicines (called NSAIDs) are usually suggested. Sometimes a stronger form is needed. A stronger form would require a prescription.  Other treatment may be needed after the swelling goes down. Possibilities include:  Exercise. Certain exercises can make the muscles around the knee stronger which decreases the pressure on the knee cap. This includes the thigh muscle. Certain exercises also may be suggested to increase your flexibility.  A knee brace. This gives the knee extra support   and helps align the movement of the knee cap.  Orthotics. These are special shoe inserts. They can help keep your leg and knee aligned.  Surgery is sometimes needed. This is rare. Options include:  Arthroscopy. The surgeon uses a special tool to remove any damaged pieces of the kneecap. Only a few small incisions (cuts) are needed.  Realignment.  This is open surgery. The goals are to reduce pressure and fix the way the kneecap moves. HOME CARE INSTRUCTIONS   Take any medication prescribed by your healthcare provider. Follow the directions carefully.  If your knee is swollen:  Put ice or cold packs on it. Do this for 20 to 30 minutes, 3 to 4 times a day.  Keep the knee raised. Make sure it is supported. Put a pillow under it.  Rest your knee. For example, take the elevator instead of the stairs for awhile. Or, take a break from sports activity that strain your knee. Try walking or swimming instead.  Whenever you are active:  Use an elastic bandage on your knee. This gives it support.  After any activity, put ice or cold packs on your knees. Do this for about 10 to 20 minutes.  Make sure you wear shoes that give good support. Make sure they are not worn down. The heels should not slant in or out. SEEK MEDICAL CARE IF:   Knee pain gets worse. Or it does not go away, even after taking pain medicine.  Swelling does not go down.  Your thigh muscle becomes weak.  You have an oral temperature above 102 F (38.9 C). SEEK IMMEDIATE MEDICAL CARE IF:  You have an oral temperature above 102 F (38.9 C), not controlled by medicine. Document Released: 01/10/2009 Document Revised: 04/16/2011 Document Reviewed: 04/13/2013 ExitCare Patient Information 2015 [employer]. This information is not intended to replace advice given to you by your health care provider. Make sure you discuss any questions you have with your health care provider.  

## 2014-05-04 NOTE — Telephone Encounter (Signed)
Pt states Ritalin needs P.A. Ins. is [employer] [place].  Initiated P.A. Methylphenidate

## 2014-05-04 NOTE — Telephone Encounter (Signed)
P.A. Approved, pt & pharmacy informed, went thru for $12
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
## 2014-05-06 ENCOUNTER — Telehealth: Payer: Self-pay | Admitting: Medical

## 2014-05-06 NOTE — Telephone Encounter (Signed)
I spoke to Nani SkillernErin Spence with [facility] [phone])  about her case.  Her counselor Ms. Mliss SaxSpence is in agreement with me that her concerns and symptoms do represent focus issues, attention deficit, anxiety and was happy to see that she started on stimulant medication.  She did mention however that Mrs. Juanetta GoslingHawkins mentioned a weight loss pill that another doctor may have provided to her. I advise that was not aware of this and would like to have her contact me about this when she comes in to her counseling visit this week  Ms. Spence and I will touch base from time to time.

## 2014-06-30 ENCOUNTER — Other Ambulatory Visit: Payer: Self-pay | Admitting: Family Medicine

## 2014-06-30 ENCOUNTER — Telehealth: Payer: Self-pay | Admitting: Medical

## 2014-06-30 MED ORDER — SYNTHROID 100 MCG PO TABS: 100.0000 ug | ORAL_TABLET | Freq: Every day | ORAL | Status: DC

## 2014-06-30 NOTE — Telephone Encounter (Signed)
Pt. Called requesting refill on SYNTHROID 100mcg states she is going out of town and needs the refill asap. [facility] # [phone]. Fax # [phone].

## 2014-06-30 NOTE — Telephone Encounter (Signed)
Rx refill was sent to her mail order pharmacy.

## 2014-07-09 ENCOUNTER — Encounter: Payer: Self-pay | Admitting: Medical

## 2014-07-09 ENCOUNTER — Ambulatory Visit
Admission: RE | Admit: 2014-07-09 | Discharge: 2014-07-09 | Disposition: A | Discharge status: Home / Self Care | Payer: BC Managed Care – PPO | Source: Ambulatory Visit | Attending: Medical | Admitting: Medical

## 2014-07-09 ENCOUNTER — Ambulatory Visit (INDEPENDENT_AMBULATORY_CARE_PROVIDER_SITE_OTHER): Payer: BC Managed Care – PPO | Admitting: Medical

## 2014-07-09 VITALS — BP 112/70 | HR 80 | Temp 97.8°F | Resp 16 | Wt 204.0 lb

## 2014-07-09 DIAGNOSIS — M25561 Pain in right knee: Secondary | ICD-10-CM

## 2014-07-09 MED ORDER — TRAMADOL HCL 50 MG PO TABS: 50.0000 mg | ORAL_TABLET | Freq: Every evening | ORAL | Status: DC | PRN

## 2014-07-09 NOTE — Progress Notes (Signed)
   Subjective:   Gail Bartlett is a 44 y.o. female presenting on 07/09/2014 with right knee pain  Here for ongoing right knee pain.  We discussed this briefly last visit few months ago.  Continues to have right knee pain without swelling.  Worse if knee bent for long periods or worse the longer she is on the knee.  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  Doesn't do a lot of bending and stooping.  denies back pain, leg numbness, tingling or weakness.  Denies specific injury trauma or fall.  No hip or ankle pains.  Wears supportive shoes at work.  Using naprosyn, the OTC worked better than prescription naprosyn.   Using ice some.  been using ice, heat.  No stretching though. Tried knee brace but made the pain worse.  Back of knee hurts bad, she is on the move constantly. No prior similar.  No other aggravating or relieving factors. No other complaint.  Review of Systems ROS as in subjective      Objective:    BP 112/70 mmHg  Pulse 80  Temp(Src) 97.8 F (36.6 C) (Oral)  Resp 16  Wt 204 lb (92.534 kg)  General appearance: alert, no distress, WD/WN Skin: unremarkable MSK: tender over patella, mild tenderness right lateral knee joint line, mild pain with patellar tracking, otherwise no laxity, no swelling, no deformity, ROM full, hips ankles and rest of bilat LE exam unremarkable Back: no obviosu scoliosis or tenderness, normal ROM Legs neurovascularly intact No edema     Assessment: Encounter Diagnosis  Name Primary?  . Right knee pain Yes     Plan: Still suspect patellofemoral syndrome, but need to rule out other.  Will send for xray, c/t naprosyn, may use ultram for worse pain.  Discussed risks/benefits of medication.  Consider PT referral if xray normal.    [name] was seen today for right knee pain.  Diagnoses and all orders for this visit:  Right knee pain Orders: -     DG Knee Complete 4 Views Right; Future  Other orders -     traMADol  (ULTRAM) 50 MG tablet; Take 1 tablet (50 mg total) by mouth at bedtime as needed.    Return pending xray.

## 2014-10-07 DIAGNOSIS — Z1159 Encounter for screening for other viral diseases: Secondary | ICD-10-CM

## 2014-10-07 DIAGNOSIS — Z789 Other specified health status: Secondary | ICD-10-CM

## 2014-10-07 HISTORY — DX: Other specified health status: Z78.9

## 2014-10-07 HISTORY — DX: Encounter for screening for other viral diseases: Z11.59

## 2014-10-12 ENCOUNTER — Other Ambulatory Visit: Payer: Self-pay | Admitting: Medical

## 2014-10-12 ENCOUNTER — Telehealth: Payer: Self-pay

## 2014-10-12 MED ORDER — SYNTHROID 100 MCG PO TABS: 100.0000 ug | ORAL_TABLET | Freq: Every day | ORAL | Status: DC

## 2014-10-12 NOTE — Telephone Encounter (Signed)
Refill request for Synthroid #90 and would also like to have refills on this.

## 2014-10-12 NOTE — Telephone Encounter (Signed)
Med sent, have her return in October for yearly physical.

## 2014-10-13 NOTE — Telephone Encounter (Signed)
Pt is scheduled for 11/30/14

## 2014-10-18 ENCOUNTER — Encounter: Payer: Self-pay | Admitting: Family Medicine

## 2014-10-18 ENCOUNTER — Ambulatory Visit (INDEPENDENT_AMBULATORY_CARE_PROVIDER_SITE_OTHER): Payer: BC Managed Care – PPO | Admitting: Family Medicine

## 2014-10-18 VITALS — BP 140/80 | HR 60 | Wt 207.8 lb

## 2014-10-18 DIAGNOSIS — R141 Gas pain: Secondary | ICD-10-CM

## 2014-10-18 DIAGNOSIS — R1013 Epigastric pain: Secondary | ICD-10-CM

## 2014-10-18 DIAGNOSIS — R143 Flatulence: Secondary | ICD-10-CM | POA: Diagnosis not present

## 2014-10-18 DIAGNOSIS — R142 Eructation: Secondary | ICD-10-CM

## 2014-10-18 LAB — POCT URINALYSIS DIPSTICK
BILIRUBIN UA: NEGATIVE
Blood, UA: NEGATIVE
Glucose, UA: NEGATIVE
Ketones, UA: NEGATIVE
Leukocytes, UA: NEGATIVE
Nitrite, UA: NEGATIVE
Protein, UA: NEGATIVE
SPEC GRAV UA: 1.015
UROBILINOGEN UA: NEGATIVE
pH, UA: 6

## 2014-10-18 NOTE — Progress Notes (Signed)
   Subjective:    Patient ID: Gail Bartlett, female    DOB: 11/20/1970, 44 y.o.   MRN: [id number]  HPI She is here for a 12 day history of intermittent epigastric abdominal pain, bloating, eructation and occasional nausea and vomiting.  The pain starts typically 1-3 hours after eating supper and resolves by the next morning. Describes pain as spasming and states the pain has also radiated around her right flank for past few days. States she typically has 6- 8 hours of pain and discomfort before it resolves. States she has normal bowel movements and denies blood or pus in her stool.  She has a history of GERD and was taking medication for this twice daily. States she stopped taking her reflux medicine 2 months ago because she was worried about long-term side effects. She has had a tubal ligation,c-section and ablation in past. Reports she has not had vaginal bleeding for the past 7 years.   Has tried drinking ginger ale, Gas-X, immodium. States she has tried to eat foods lower in fat and grease but has been eating shortly before laying down at night. She works in [employer] 7a-7p daily.    Denies fever, chills, diarrhea, blood in stool, urinary symptoms, vaginal discharge.  Eructation here in office.   Does not smoke, drink or use drugs.   Reviewed her allergies, medications, past medical and surgical history.  Review of Systems Pertinent positives and negatives in the history of present illness.    Objective:   Physical Exam  Constitutional: She is oriented to person, place, and time. She appears well-developed and well-nourished. No distress.  Cardiovascular: Normal rate, regular rhythm and normal heart sounds.   Pulmonary/Chest: Effort normal and breath sounds normal. No respiratory distress. She has no wheezes. She has no rales.  Abdominal: Soft. Normal appearance and bowel sounds are normal. She exhibits no distension and no ascites. There is no hepatosplenomegaly. There is tenderness  in the right upper quadrant and left lower quadrant. There is positive Murphy's sign. There is no rigidity, no rebound, no guarding, no CVA tenderness and no tenderness at McBurney's point.  Neurological: She is alert and oriented to person, place, and time.  Skin: Skin is warm and dry. No bruising and no rash noted. No pallor.  Striae to abdomen      Urine dipstick negative    Assessment & Plan:  Flatulence, eructation and gas pain - Plan: US Abdomen Complete  Abdominal pain, epigastric - Plan: US Abdomen Complete  Her urine is negative. Her symptoms are leading me to possible gallbladder disease. There are no signs of infection or obstruction. We will send her for an ultrasound to rule out gallbladder dysfunction. Advised her to eat a low fat diet and avoid grease. Recommended that she also start taking her reflux medication again. Discussed that she should avoid eating 3 hours before lying down and eat small frequent meals as opposed to large heavy ones. We will follow up pending ultrasound results.

## 2014-10-18 NOTE — Patient Instructions (Addendum)
We will send you for an Ultrasound to rule out gallbladder dysfunction. They can get you in Thursday morning. Until then please avoid greasy foods and fried food and eat a bland diet. Start taking your reflux medication.    Food Choices for Gastroesophageal Reflux Disease When you have gastroesophageal reflux disease (GERD), the foods you eat and your eating habits are very important. Choosing the right foods can help ease the discomfort of GERD. WHAT GENERAL GUIDELINES DO I NEED TO FOLLOW?  Choose fruits, vegetables, whole grains, low-fat dairy products, and low-fat meat, fish, and poultry.  Limit fats such as oils, salad dressings, butter, nuts, and avocado.  Keep a food diary to identify foods that cause symptoms.  Avoid foods that cause reflux. These may be different for different people.  Eat frequent small meals instead of three large meals each day.  Eat your meals slowly, in a relaxed setting.  Limit fried foods.  Cook foods using methods other than frying.  Avoid drinking alcohol.  Avoid drinking large amounts of liquids with your meals.  Avoid bending over or lying down until 2-3 hours after eating. WHAT FOODS ARE NOT RECOMMENDED? The following are some foods and drinks that may worsen your symptoms: Vegetables Tomatoes. Tomato juice. Tomato and spaghetti sauce. Chili peppers. Onion and garlic. Horseradish. Fruits Oranges, grapefruit, and lemon (fruit and juice). Meats High-fat meats, fish, and poultry. This includes hot dogs, ribs, ham, sausage, salami, and bacon. Dairy Whole milk and chocolate milk. Sour cream. Cream. Butter. Ice cream. Cream cheese.  Beverages Coffee and tea, with or without caffeine. Carbonated beverages or energy drinks. Condiments Hot sauce. Barbecue sauce.  Sweets/Desserts Chocolate and cocoa. Donuts. Peppermint and spearmint. Fats and Oils High-fat foods, including [place] fries and potato chips. Other Vinegar. Strong spices, such as  black pepper, white pepper, red pepper, cayenne, curry powder, cloves, ginger, and chili powder. The items listed above may not be a complete list of foods and beverages to avoid. Contact your dietitian for more information. Document Released: 01/22/2005 Document Revised: 01/27/2013 Document Reviewed: 11/26/2012 [facility] Patient Information 2015 [place], [place]. This information is not intended to replace advice given to you by your health care provider. Make sure you discuss any questions you have with your health care provider.

## 2014-10-20 ENCOUNTER — Ambulatory Visit
Admission: RE | Admit: 2014-10-20 | Discharge: 2014-10-20 | Disposition: A | Discharge status: Home / Self Care | Payer: BC Managed Care – PPO | Source: Ambulatory Visit | Attending: Family Medicine | Admitting: Family Medicine

## 2014-10-20 ENCOUNTER — Encounter: Payer: Self-pay | Admitting: Medical

## 2014-10-20 ENCOUNTER — Ambulatory Visit (INDEPENDENT_AMBULATORY_CARE_PROVIDER_SITE_OTHER): Payer: BC Managed Care – PPO | Admitting: Medical

## 2014-10-20 VITALS — BP 108/68 | HR 70 | Temp 98.2°F | Resp 18 | Wt 207.2 lb

## 2014-10-20 DIAGNOSIS — E038 Other specified hypothyroidism: Secondary | ICD-10-CM

## 2014-10-20 DIAGNOSIS — R05 Cough: Secondary | ICD-10-CM | POA: Diagnosis not present

## 2014-10-20 DIAGNOSIS — K219 Gastro-esophageal reflux disease without esophagitis: Secondary | ICD-10-CM | POA: Diagnosis not present

## 2014-10-20 DIAGNOSIS — R5382 Chronic fatigue, unspecified: Secondary | ICD-10-CM | POA: Diagnosis not present

## 2014-10-20 DIAGNOSIS — R7301 Impaired fasting glucose: Secondary | ICD-10-CM

## 2014-10-20 DIAGNOSIS — R1011 Right upper quadrant pain: Secondary | ICD-10-CM

## 2014-10-20 DIAGNOSIS — E039 Hypothyroidism, unspecified: Secondary | ICD-10-CM | POA: Insufficient documentation

## 2014-10-20 DIAGNOSIS — E669 Obesity, unspecified: Secondary | ICD-10-CM

## 2014-10-20 DIAGNOSIS — R142 Eructation: Principal | ICD-10-CM

## 2014-10-20 DIAGNOSIS — R143 Flatulence: Principal | ICD-10-CM

## 2014-10-20 DIAGNOSIS — R141 Gas pain: Secondary | ICD-10-CM

## 2014-10-20 DIAGNOSIS — R053 Chronic cough: Secondary | ICD-10-CM

## 2014-10-20 DIAGNOSIS — R1013 Epigastric pain: Secondary | ICD-10-CM

## 2014-10-20 LAB — CBC
HCT: 39 % (ref 36.0–46.0)
Hemoglobin: 12.7 g/dL (ref 12.0–15.0)
MCH: 27.3 pg (ref 26.0–34.0)
MCHC: 32.6 g/dL (ref 30.0–36.0)
MCV: 83.7 fL (ref 78.0–100.0)
MPV: 9.7 fL (ref 8.6–12.4)
PLATELETS: 314 10*3/uL (ref 150–400)
RBC: 4.66 MIL/uL (ref 3.87–5.11)
RDW: 14.9 % (ref 11.5–15.5)
WBC: 11.2 10*3/uL — ABNORMAL HIGH (ref 4.0–10.5)

## 2014-10-20 NOTE — Progress Notes (Signed)
   Subjective: Chief Complaint  Patient presents with  . thyroid check   Here for f/u on hypothyroidism.   Compliant with synthroid daily.  Can't tolerate generic.  Having problems with scripts not having refills and copay is $80 per 22mo  Obesity - wants help losing weight.  Despite eating healthy, exercising, can't seem to lose weight.  Still c/o ongoing fatigue, without daytime somnolence and is rested most days.  No witnessed snoring or apnea.   No bleeding, no fever, no weight changes  Was seen recent here for RUQ abdominal pain, had [place] today, and still having episodes of intermittent intense RUQ pain with nausea, worse with eating, has had some intermittent vomiting.   No constipation, no diarrhea, no blood in stool  GERD - takes PPI and H2 blocker, and thinks they work ok, not great.  No other aggravating or relieving factors. No other complaint.  Past Medical History  Diagnosis Date  . Impaired fasting glucose     when at 200 pounds  . GERD (gastroesophageal reflux disease)   . Trichomonas vaginalis infection 04/2011  . Chronic cough   . Allergic rhinitis   . Obesity   . Amenorrhea     since ablation in 2007  . Hypothyroidism age 44     Objective: BP 108/68 mmHg  Pulse 70  Temp(Src) 98.2 F (36.8 C) (Oral)  Resp 18  Wt 207 lb 3.2 oz (93.985 kg)  General appearance: alert, no distress, WD/WN HEENT: normocephalic, sclerae anicteric, TMs pearly, nares patent, no discharge or erythema, pharynx normal Oral cavity: MMM, no lesions Neck: supple, no lymphadenopathy, no thyromegaly, no masses Heart: RRR, normal S1, S2, no murmurs Lungs: CTA bilaterally, no wheezes, rhonchi, or rales Abdomen: +bs, soft, tender RUQ and LLQ, otherwise non tender, non distended, no masses, no hepatomegaly, no splenomegaly Pulses: 2+ symmetric, upper and lower extremities, normal cap refill Ext: no edema   Assessment: Encounter Diagnoses  Name Primary?  . Other specified  hypothyroidism Yes  . RUQ abdominal pain   . Chronic fatigue   . Gastroesophageal reflux disease without esophagitis   . Obesity   . Impaired fasting blood sugar   . Chronic cough     Plan: hypothyroidism - has been stable for about 2 years.  C/t current medication Synthroid .  Taking name brand Synthroid.  Doesn't tolerate generic.  Send refills to [facility] order synthroid direct program  RUQ pain - [place] reviewed, lots of gall stones noted without cholecystitis.   Labs today, consider HIDA scan, discussed diet measures.  Chronic cough - occasional cough now, but for the most part hasn't continued to get this.    Chronic fatigue - likely due in part to weight and thyroid condition.  Not anemic, is exercising.  Sleep apnea not particularly suspect, no concern for depression  GERD - c/t H2 blocker and PPI for now but consider changing to different PPI  Obesity - discussed beginning Qsymia.  Risks/benefits discussed vs phentermine generic.   pending labs, consider beginning this medications along with c/t diet and exercise efforts.  Impaired glucose - work on lifestyle changes  F/u pending labs

## 2014-10-21 ENCOUNTER — Telehealth: Payer: Self-pay | Admitting: Medical

## 2014-10-21 ENCOUNTER — Ambulatory Visit: Payer: Self-pay | Admitting: General Surgery

## 2014-10-21 ENCOUNTER — Other Ambulatory Visit: Payer: Self-pay | Admitting: Medical

## 2014-10-21 ENCOUNTER — Other Ambulatory Visit: Payer: BC Managed Care – PPO

## 2014-10-21 LAB — HEPATIC FUNCTION PANEL
ALBUMIN: 3.9 g/dL (ref 3.6–5.1)
ALT: 276 U/L — ABNORMAL HIGH (ref 6–29)
AST: 63 U/L — ABNORMAL HIGH (ref 10–30)
Alkaline Phosphatase: 153 U/L — ABNORMAL HIGH (ref 33–115)
BILIRUBIN TOTAL: 0.6 mg/dL (ref 0.2–1.2)
Bilirubin, Direct: 0.2 mg/dL (ref <=0.2)
Indirect Bilirubin: 0.4 mg/dL (ref 0.2–1.2)
Total Protein: 6.5 g/dL (ref 6.1–8.1)

## 2014-10-21 LAB — TSH: TSH: 7.061 u[IU]/mL — ABNORMAL HIGH (ref 0.350–4.500)

## 2014-10-21 LAB — BASIC METABOLIC PANEL
BUN: 13 mg/dL (ref 7–25)
CO2: 26 mmol/L (ref 20–31)
CREATININE: 1.05 mg/dL (ref 0.50–1.10)
Calcium: 9.2 mg/dL (ref 8.6–10.2)
Chloride: 104 mmol/L (ref 98–110)
Glucose, Bld: 77 mg/dL (ref 65–99)
Potassium: 3.8 mmol/L (ref 3.5–5.3)
Sodium: 142 mmol/L (ref 135–146)

## 2014-10-21 LAB — T4, FREE: FREE T4: 0.9 ng/dL (ref 0.80–1.80)

## 2014-10-21 MED ORDER — SYNTHROID 112 MCG PO TABS: 112.0000 ug | ORAL_TABLET | Freq: Every day | ORAL | Status: DC

## 2014-10-21 NOTE — H&P (Signed)
History of Present Illness Gail Carl MD; 10/21/2014 3:33 PM) Patient words: gallbladder.  The patient is a 44 year old female who presents for evaluation of gall stones. The onset of the gall stones has been gradual and has been occurring in a recurrent pattern for 1 month. The course has been without change. The gall stones is described as moderate. There has been associated back pain, belching, bloating and nausea. Precipitating factors include excessive fat intake (notes fried chicken and fries). Relieving factors include rest (time and heating pad).   Other Problems Fay Records, CMA; 10/21/2014 2:56 PM) Gastroesophageal Reflux Disease Thyroid Disease  Past Surgical History Fay Records, CMA; 10/21/2014 2:56 PM) Cesarean Section - 1  Diagnostic Studies History Fay Records, CMA; 10/21/2014 2:56 PM) Colonoscopy never Mammogram within last year  Allergies Fay Records, CMA; 10/21/2014 2:56 PM) No Known Drug Allergies09/15/2016  Medication History Fay Records, CMA; 10/21/2014 2:56 PM) Synthroid ( Tablet, Oral) Active. Medications Reconciled  Social History Fay Records, [place]; 10/21/2014 2:56 PM) Caffeine use Coffee. No alcohol use No drug use Tobacco use Never smoker.  Family History Fay Records, [place]; 10/21/2014 2:56 PM) Diabetes Mellitus Brother. Hypertension Mother. Kidney Disease Mother.  Pregnancy / Birth History Fay Records, CMA; 10/21/2014 2:56 PM) Age at menarche 10 years. Gravida 2 Irregular periods Maternal age 30-20 Para 2  Review of Systems (Gail Carl MD; 10/21/2014 3:12 PM) General Not Present- Appetite Loss, Chills, Fatigue, Fever, Night Sweats, Weight Gain and Weight Loss. Skin Not Present- Change in Wart/Mole, Dryness, Hives, Jaundice, New Lesions, Non-Healing Wounds, Rash and Ulcer. HEENT Not Present- Earache, Hearing Loss, Hoarseness, Nose Bleed, Oral Ulcers, Ringing in the Ears, Seasonal Allergies, Sinus Pain, Sore  Throat, Visual Disturbances, Wears glasses/contact lenses and Yellow Eyes. Respiratory Present- Snoring. Not Present- Bloody sputum, Chronic Cough, Difficulty Breathing and Wheezing. Breast Not Present- Breast Mass, Breast Pain, Nipple Discharge and Skin Changes. Cardiovascular Not Present- Chest Pain, Difficulty Breathing Lying Down, Leg Cramps, Palpitations, Rapid Heart Rate, Shortness of Breath and Swelling of Extremities. Gastrointestinal Present- Abdominal Pain, Bloating and Vomiting. Not Present- Bloody Stool, Change in Bowel Habits, Chronic diarrhea, Constipation, Difficulty Swallowing, Excessive gas, Gets full quickly at meals, Hemorrhoids, Indigestion, Nausea and Rectal Pain. Female Genitourinary Not Present- Frequency, Nocturia, Painful Urination, Pelvic Pain and Urgency. Musculoskeletal Not Present- Back Pain, Joint Pain, Joint Stiffness, Muscle Pain, Muscle Weakness and Swelling of Extremities. Neurological Not Present- Decreased Memory, Fainting, Headaches, Numbness, Seizures, Tingling, Tremor, Trouble walking and Weakness. Psychiatric Not Present- Anxiety, Bipolar, Change in Sleep Pattern, Depression, Fearful and Frequent crying. Endocrine Not Present- Cold Intolerance, Excessive Hunger, Hair Changes, Heat Intolerance, Hot flashes and New Diabetes. Hematology Not Present- Easy Bruising, Excessive bleeding, Gland problems, HIV and Persistent Infections.   Vitals Fay Records CMA; 10/21/2014 2:57 PM) 10/21/2014 2:56 PM Weight: 209 lb Height: 65in Body Surface Area: 2.09 m Body Mass Index: 34.78 kg/m Temp.: 98.57F(Temporal)  Pulse: 80 (Regular)  BP: 130/72 (Sitting, Left Arm, Standard)    Physical Exam Gail Carl MD; 10/21/2014 3:12 PM) General Mental Status-Alert. General Appearance-Cooperative. Orientation-Oriented X4. Posture-Normal posture.  Integumentary Global Assessment Normal Exam - Head/Face: no rashes, ulcers, lesions or evidence of  photo damage. No palpable nodules or masses and Neck: no visible lesions or palpable masses.  Head and Neck Head-normocephalic, atraumatic with no lesions or palpable masses. Face Global Assessment - atraumatic. Thyroid Gland Characteristics - normal size and consistency.  Eye Eyeball - Bilateral-Extraocular movements intact. Sclera/Conjunctiva - Bilateral-No scleral icterus, No Discharge.  ENMT Nose and Sinuses Nose - no deformities observed, no swelling present.  Chest and Lung Exam Palpation Normal exam - Non-tender. Auscultation Breath sounds - Normal.  Cardiovascular Auscultation Rhythm - Regular. Heart Sounds - S1 WNL and S2 WNL. Carotid arteries - No Carotid bruit.  Abdomen Inspection Normal Exam - No Visible peristalsis, No Abnormal pulsations and No Paradoxical movements. Palpation/Percussion Normal exam - Soft, No Rebound tenderness, No Rigidity (guarding), No hepatosplenomegaly and No Palpable abdominal masses. Tenderness - Right Upper Quadrant. Gallbladder - Negative Murphy's sign.  Peripheral Vascular Upper Extremity Palpation - Pulses bilaterally normal. Lower Extremity Palpation - Edema - Bilateral - No edema.  Neurologic Neurologic evaluation reveals -normal sensation and normal coordination.  Neuropsychiatric Mental status exam performed with findings of-able to articulate well with normal speech/language, rate, volume and coherence and thought content normal with ability to perform basic computations and apply abstract reasoning.  Musculoskeletal Normal Exam - Bilateral-Upper Extremity Strength Normal and Lower Extremity Strength Normal.    Assessment & Plan Gail Carl MD; 10/21/2014 3:34 PM) GALL BLADDER STONES (K80.20) Impression: 44 yo female with 1 month of ruq pain, found to have multiple gallbladder stones on [place] consistent with symptomatic gallstones vs chronic cholecystitis. Current Plans    The anatomy & physiology  of hepatobiliary & pancreatic function was discussed. The pathophysiology of gallbladder dysfunction was discussed. Natural history risks without surgery was discussed. I feel the risks of no intervention will lead to serious problems that outweigh the operative risks; therefore, I recommended cholecystectomy to remove the pathology. I explained laparoscopic techniques with possible need for an open approach. Probable cholangiogram to evaluate the bilary tract was explained as well.  Risks such as bleeding, infection, abscess, leak, injury to other organs, need for further treatment, heart attack, death, and other risks were discussed. I noted a good likelihood this will help address the problem. Possibility that this will not correct all abdominal symptoms was explained. Goals of post-operative recovery were discussed as well. We will work to minimize complications. An educational handout further explaining the pathology and treatment options was given as well. Questions were answered. The patient expresses understanding & wishes to proceed with surgery. Pt Education - Laparoscopic Cholecystectomy: gallbladder Pt Education - Pamphlet Given - Laparoscopic Gallbladder Surgery: discussed with patient and provided information. You are being scheduled for surgery - Our schedulers will call you.  You should hear from our office's scheduling department within 5 working days about the location, date, and time of surgery. We try to make accommodations for patient's preferences in scheduling surgery, but sometimes the OR schedule or the surgeon's schedule prevents [place] from making those accommodations.  If you have not heard from our office [phone] in 5 working days, call the office and ask for your surgeon's nurse.  If you have other questions about your diagnosis, plan, or surgery, call the office and ask for your surgeon's nurse.

## 2014-10-21 NOTE — Telephone Encounter (Signed)
See other message

## 2014-10-21 NOTE — Telephone Encounter (Signed)
Pt called and seen her results on my chart and was wanting to know about having surgery,   She was wondering why hasn't anyone called her to let her know about her results , Was also talking about her thryoid levels being up, pt can be reached at [phone]

## 2014-10-22 ENCOUNTER — Other Ambulatory Visit: Payer: Self-pay

## 2014-10-22 ENCOUNTER — Telehealth: Payer: Self-pay | Admitting: Medical

## 2014-10-22 MED ORDER — SYNTHROID 112 MCG PO TABS: 112.0000 ug | ORAL_TABLET | Freq: Every day | ORAL | Status: DC

## 2014-10-22 NOTE — Telephone Encounter (Signed)
Pt states we sent the Rx Synthroid to wrong pharmacy said [facility] isn't a mail order pharmacy.  Her pharmacy is [facility] [address] [place]. T# [phone] and fax # [phone]  And she needs it resent today

## 2014-10-22 NOTE — Telephone Encounter (Signed)
Called pt and let her know we don't have the samples she requested and that her prescription was filled on the 15th and sent to [facility].

## 2014-10-22 NOTE — Telephone Encounter (Signed)
cheri done this

## 2014-10-22 NOTE — Telephone Encounter (Signed)
Requesting samples of Synthroid 1112 mcg. She is between pharmacies and insurance issues so she wants to know if she can get samples for now

## 2014-10-25 ENCOUNTER — Telehealth: Payer: Self-pay | Admitting: Medical

## 2014-10-25 ENCOUNTER — Other Ambulatory Visit: Payer: Self-pay

## 2014-10-25 MED ORDER — LEVOTHYROXINE SODIUM 112 MCG PO TABS: 112.0000 ug | ORAL_TABLET | Freq: Every day | ORAL | Status: DC

## 2014-10-25 NOTE — Telephone Encounter (Signed)
Call in the 30 day supply of Synthroid 112 to the [facility] pharmacy she listed

## 2014-10-25 NOTE — Telephone Encounter (Signed)
I have sent 10 day supply to [facility] pharmacy

## 2014-10-25 NOTE — Telephone Encounter (Signed)
Pt called and stated that her thyroid medication will not be in for several days. She would like a temporary supply sent in . She can get a 10 day supply for $20 at her pharmacy thru work. Please send in Synthroid 112 to [facility] pharmacy. Fax number is [phone].

## 2014-10-25 NOTE — Telephone Encounter (Signed)
Pt informed of #10 Synthroid called in and pt did go for initial appt for surgery & it has been scheduled for the end of October.

## 2014-10-25 NOTE — Telephone Encounter (Signed)
Left message for pt, want to make sure that all her questions were answered

## 2014-11-02 ENCOUNTER — Telehealth: Payer: Self-pay | Admitting: Medical

## 2014-11-02 NOTE — Telephone Encounter (Signed)
Pt called and stated that she was sent to [facility] for gall bladder surgery. She states she was told that she would get a call concerning that. She didn't so she call them and was informed by a a scheduler named Eunice Blase that she would have to pay her deductable before they would schedule her surgery. Pt was upset that she wasn't told that earlier. She states that she no longer wants to have her surgery there. She states she is going the check into having her surgery at [facility] where she works. Pt was told to find out want she needs from them and call me back and I would send info to [place] when he returns. Pt verbalized understanding and will call back.

## 2014-11-03 NOTE — Telephone Encounter (Signed)
Pt called back and stated she started the process to have her surgery switched to [facility]. She came by and signed a medical records request to have her records concerning this issue faxed to [facility] scheduling. I faxed those per her instructions and gave pt a copy for her records or in case there was an issue with them receiving records. Pt to keep [place] inform on situation.

## 2014-11-05 ENCOUNTER — Telehealth: Payer: Self-pay | Admitting: Medical

## 2014-11-05 NOTE — Telephone Encounter (Signed)
Pt states she had more labs done yesterday at [facility] and they said something about Hepatitis and she's unsure what they were talking about and something else was elevated even more.  She would like you to look at those labs and call her about them,  She states they are in Epic also. I can't see them but you may be able to. She has an appt with you on Thursday but would like you to call her on Monday.

## 2014-11-06 HISTORY — PX: CHOLECYSTECTOMY: SHX55

## 2014-11-08 NOTE — Telephone Encounter (Signed)
Lafonda Mosses, not sure if this is worth calling the office manager at [facility] but this is the second recent issue I've had with [facility].  See the info below on this patient.  I will send a separate note about another patient.

## 2014-11-08 NOTE — Telephone Encounter (Signed)
Waiting on results from [facility]

## 2014-11-08 NOTE — Telephone Encounter (Signed)
The liver tests we did shows hepatitis or inflammation of the liver, [place] due to gall bladder disease.   I don't have any other outside labs available to view.  If she was + for Hepatitis A, B or C, then this would change our treatment potentially.  Can she fax or bring by the labs she is referring too?

## 2014-11-08 NOTE — Telephone Encounter (Signed)
I called pt & had her ask [facility] to fax labs. Recv'd faxed labs and gave to [facility] to reivew Please call pt with results

## 2014-11-09 NOTE — Telephone Encounter (Signed)
Labs that came in shows she is Immune to Hep B, but negative for Hep A, B, and C infection.     Liver tests were even higher suggesting possible gall stone blockage or inflammation of liver or gall bladder, presumably gall bladder.  Does she have a surgery consult date?  Are her symptoms worse?

## 2014-11-09 NOTE — Telephone Encounter (Signed)
Spoke with patient. She has surgery scheduled for 11/26/14 at [facility]. Patient said she is about the same. She is drinking liquids to avoid the possibility of pain. She will call if she has any concerns.

## 2014-11-11 ENCOUNTER — Institutional Professional Consult (permissible substitution): Payer: BC Managed Care – PPO | Admitting: Medical

## 2014-11-12 ENCOUNTER — Telehealth: Payer: Self-pay | Admitting: Medical

## 2014-11-12 NOTE — Telephone Encounter (Signed)
Dr. Lynelle Doctor, can you look over this patient's labs and last OV.   Unless I am misinterpreting the data, I believe her clinical picture and labs suggest gall bladder disease.   Given an ultrasound loaded full of gall stones, and elevated LFTs, I referred to general surgery.    Dr. Susann Givens had discussed the patient with Vickie prior to me seeing her, and she was sent for ultrasound originally.  I have not done a HIDA scan, and I didn't necessarily think she needed to see GI for endoscopic ultrasound or ERCP, thus the referral to surgeon.   In the meantime,labs were also done elsewhere, and maybe something was lost in translation, but the labs I saw showed negative for viral hepatitis A, B, C, and to me everything points to gall bladder or other biliary or hepatic process.    Thanks for your help. [name]

## 2014-11-12 NOTE — Telephone Encounter (Signed)
Pt called and stated that her surgery had been cancelled by the surgeon, Dr. Tamsen Meek. Pt was very upset so I called the surgeon's office and spoke to her assistant, Beth. Per Beth Dr. Derrill Kay will not do the surgery until her abnormal labs are addressed. She is requiring the pt be seen and cleared for surgery by her PCP. She stated that before surgery could be rescheduled she would need something in writing from [place] clearing her for surgery. She specifically wants the abnormal labs results addressed. Pt scheduled an appt on 11/18/2014 to come in and see you. We will need a letter sent to surgery to clear her. Dr. Jobe Marker phone number is [phone] and fax number is [phone].

## 2014-11-14 NOTE — Telephone Encounter (Signed)
I looked over visits, labs, imaging.  I do not see any more recent labs in Care Everywhere from [facility], so I don't see hepatitis testing results or any labs more recent than 9/14. Not sure if the LFT's have changed on more recent labs. Could she have been referring to the TSH being off?  At this point, I would recommend repeating CBC and LFT's. Her initial history was also suspect for recurrent reflux, given that she had been off her meds, but LFT's would suggest gall bladder disease (both may have been a factor in causing symptoms).  But, if symptoms haven't improved with her being back on reflux meds, then agree that surgical consult is appropriate.  Depending on what the labs show, you can discuss with GI to see which would be more appropriate f/u.   (her bilirubin levels had been normal, alk phos only slightly elevated, but this was 3 weeks ago, not sure if you have more recent labs that I can't see, but that's what I would be looking for with current labs). Hope that helps

## 2014-11-15 NOTE — Telephone Encounter (Signed)
I called and left message for her to call back.

## 2014-11-16 ENCOUNTER — Telehealth: Payer: Self-pay

## 2014-11-16 ENCOUNTER — Telehealth: Payer: Self-pay | Admitting: Medical

## 2014-11-16 NOTE — Telephone Encounter (Signed)
Gail Bartlett spoke with pt yesterday

## 2014-11-16 NOTE — Telephone Encounter (Signed)
Spoke with her regarding her Hep B. She said she has had it back in [place]. She said that she has no prior vaccination records at all that they all get destroyed after age 44. She is very angry about this and will be here Thursday. She said she may be able to get a hold of a titer for Hep B from [facility] a year ago.

## 2014-11-16 NOTE — Telephone Encounter (Signed)
Gen. Surgeon, Dr Tamsen Meek @ [facility] called stating she would like to speak to [name] after his appt with pt on Thur (or before if Gail Bartlett would like to). Dr Derrill Kay wants to make sure that Gail Bartlett is aware that pt is positive for hepatitis B and labs do not suggest that removing gallstones will get rid of pt's issues. This doctor can do the surgery as the pt wants but pt need hep B treatment if she is not getting it and labs suggests that pt may have something else going on. Dr Tamsen Meek can be reached on cell # [phone], pager # [phone]  office # [phone]. Also you can also talk to Alinda Money at the office #.

## 2014-11-17 ENCOUNTER — Encounter: Payer: Self-pay | Admitting: Medical

## 2014-11-17 ENCOUNTER — Telehealth: Payer: Self-pay | Admitting: Medical

## 2014-11-17 ENCOUNTER — Other Ambulatory Visit: Payer: Self-pay | Admitting: Medical

## 2014-11-17 NOTE — Telephone Encounter (Signed)
I called and left message for patient to call back.    FYI - I have discussed case with Dr. Derrill KayBrownstein and Dr. Lavonia DraftsMann/GI.  Dr. 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  Dr. Derrill KayBrownstein wanted to be sure she doesn't have active Hep B.   I called and spoke to Dr. Charna ElizabethJyothi Mann, and after discussing lab values, US results, symptoms, Dr. Loreta AveMann felt strongly that the gall stones were the source of the problem and didn't feel she needed any other eval, and that she should move forward with the cholecystectomy.

## 2014-11-17 NOTE — Telephone Encounter (Signed)
I spoke to patient .  She is aware that I have spoken to GI and surgery, and GI and primary care (us) are all in agreement that gall stones are causing the symptoms so she may proceed with surgery.   She has been vaccinated prior.   Labs support this.  Dr. Loreta AveMann GI reviewed the data and she agrees no other eval needed at this time, just to f/u with surgeon for cholecystectomy.  Pt is aware and in agreement.

## 2014-11-18 ENCOUNTER — Institutional Professional Consult (permissible substitution): Payer: BC Managed Care – PPO | Admitting: Medical

## 2014-11-19 ENCOUNTER — Encounter: Payer: Self-pay | Admitting: Medical

## 2014-11-30 ENCOUNTER — Encounter: Payer: Self-pay | Admitting: Medical

## 2014-11-30 ENCOUNTER — Telehealth: Payer: Self-pay

## 2014-11-30 ENCOUNTER — Telehealth: Payer: Self-pay | Admitting: Medical

## 2014-11-30 NOTE — Telephone Encounter (Signed)
Pt called this morning wanting to know if her CPE appt for this morning had been cancelled and it had not. Pt states that it should have been cancelled. She states that she had surgery on 10/21 and the surgeon and Vincenza HewsShane was trying to determine if the pt was approved to have the surgery prior to 10/21 so pt was left on our schedule on 10/21 while this was being determined. Pt says she was to be taken off the schedule if she had the surgery.

## 2014-11-30 NOTE — Telephone Encounter (Signed)
This patient no showed for their appointment today.Which of the following is necessary for this patient.   A) No follow-up necessary   B) Follow-up urgent. Locate Patient Immediately.   C) Follow-up necessary. Contact patient and Schedule visit in ____ Days.   D) Follow-up Advised. Contact patient and Schedule visit in ____ Days. 

## 2014-11-30 NOTE — Telephone Encounter (Signed)
Check with Gail MessierKathy about her claim that she discussed cancelling the appt with Gail MessierKathy last week.  She called about an hour before her appt today for physical to confirm it was canceled.    She mentioned the confusion the other week about the gall bladder surgery, but this had nothing to do with the scheduled CPX appt.

## 2014-12-01 NOTE — Telephone Encounter (Signed)
Olegario MessierKathy did you know anything about this?

## 2014-12-03 NOTE — Telephone Encounter (Signed)
No fee accessed.

## 2015-03-02 ENCOUNTER — Encounter: Payer: Self-pay | Admitting: Medical

## 2015-03-02 ENCOUNTER — Ambulatory Visit (INDEPENDENT_AMBULATORY_CARE_PROVIDER_SITE_OTHER): Payer: BC Managed Care – PPO | Admitting: Medical

## 2015-03-02 VITALS — BP 100/60 | HR 88 | Ht 64.75 in | Wt 214.0 lb

## 2015-03-02 DIAGNOSIS — N920 Excessive and frequent menstruation with regular cycle: Secondary | ICD-10-CM | POA: Diagnosis not present

## 2015-03-02 DIAGNOSIS — E038 Other specified hypothyroidism: Secondary | ICD-10-CM | POA: Diagnosis not present

## 2015-03-02 DIAGNOSIS — K219 Gastro-esophageal reflux disease without esophagitis: Secondary | ICD-10-CM | POA: Diagnosis not present

## 2015-03-02 DIAGNOSIS — N393 Stress incontinence (female) (male): Secondary | ICD-10-CM | POA: Diagnosis not present

## 2015-03-02 DIAGNOSIS — R05 Cough: Secondary | ICD-10-CM

## 2015-03-02 DIAGNOSIS — R7301 Impaired fasting glucose: Secondary | ICD-10-CM | POA: Diagnosis not present

## 2015-03-02 DIAGNOSIS — R202 Paresthesia of skin: Secondary | ICD-10-CM

## 2015-03-02 DIAGNOSIS — Z0000 Encounter for general adult medical examination without abnormal findings: Secondary | ICD-10-CM | POA: Diagnosis not present

## 2015-03-02 DIAGNOSIS — G5603 Carpal tunnel syndrome, bilateral upper limbs: Secondary | ICD-10-CM | POA: Insufficient documentation

## 2015-03-02 DIAGNOSIS — R053 Chronic cough: Secondary | ICD-10-CM

## 2015-03-02 DIAGNOSIS — E669 Obesity, unspecified: Secondary | ICD-10-CM | POA: Diagnosis not present

## 2015-03-02 LAB — HEPATIC FUNCTION PANEL
ALK PHOS: 81 U/L (ref 33–115)
ALT: 17 U/L (ref 6–29)
AST: 17 U/L (ref 10–30)
Albumin: 3.8 g/dL (ref 3.6–5.1)
BILIRUBIN DIRECT: 0.1 mg/dL (ref <=0.2)
BILIRUBIN INDIRECT: 0.3 mg/dL (ref 0.2–1.2)
TOTAL PROTEIN: 7 g/dL (ref 6.1–8.1)
Total Bilirubin: 0.4 mg/dL (ref 0.2–1.2)

## 2015-03-02 LAB — CBC
HEMATOCRIT: 41.4 % (ref 36.0–46.0)
Hemoglobin: 13.6 g/dL (ref 12.0–15.0)
MCH: 27 pg (ref 26.0–34.0)
MCHC: 32.9 g/dL (ref 30.0–36.0)
MCV: 82.3 fL (ref 78.0–100.0)
MPV: 9.1 fL (ref 8.6–12.4)
PLATELETS: 321 10*3/uL (ref 150–400)
RBC: 5.03 MIL/uL (ref 3.87–5.11)
RDW: 14.7 % (ref 11.5–15.5)
WBC: 10.8 10*3/uL — ABNORMAL HIGH (ref 4.0–10.5)

## 2015-03-02 LAB — HEMOGLOBIN A1C
Hgb A1c MFr Bld: 6.1 % — ABNORMAL HIGH (ref <5.7)
Mean Plasma Glucose: 128 mg/dL — ABNORMAL HIGH (ref <117)

## 2015-03-02 LAB — TSH: TSH: 0.807 u[IU]/mL (ref 0.350–4.500)

## 2015-03-02 LAB — MAGNESIUM: MAGNESIUM: 2.2 mg/dL (ref 1.5–2.5)

## 2015-03-02 LAB — VITAMIN B12

## 2015-03-02 LAB — T4, FREE: FREE T4: 1.14 ng/dL (ref 0.80–1.80)

## 2015-03-02 MED ORDER — FAMOTIDINE 20 MG PO TABS: 20.0000 mg | ORAL_TABLET | Freq: Every day | ORAL | Status: DC

## 2015-03-02 MED ORDER — DEXLANSOPRAZOLE 60 MG PO CPDR: 60.0000 mg | DELAYED_RELEASE_CAPSULE | Freq: Every day | ORAL | Status: DC

## 2015-03-02 NOTE — Progress Notes (Signed)
Subjective:   HPI  Gail Bartlett is a 45 y.o. female who presents for a complete physical.   Concerns: Obesity - would like to try weight loss medication.  Can't seem to lose the weight on her own efforts  Wants to see gyn due to heavy periods, wants to consider ablation  Still has ongoing numbness of both hands, uses the night time carpal tunnel splints without much relief   Since she had her gall bladder removed a few months ago, has been doing a lot better  Has GERD, Dexilant worked better than the omeprazole she takes now.  Still has mild cough related to GERD  Reviewed their medical, surgical, family, social, medication, and allergy history and updated chart as appropriate.  Past Medical History  Diagnosis Date  . Impaired fasting glucose     when at 200 pounds  . GERD (gastroesophageal reflux disease)   . Trichomonas vaginalis infection 04/2011  . Chronic cough   . Allergic rhinitis   . Obesity   . Amenorrhea     since ablation in 2007  . Hypothyroidism age 1    Past Surgical History  Procedure Laterality Date  . Tubal ligation    . Endometrial ablation  2006 or 2007  . Cesarean section    . Cholecystectomy  11/2014    Social History   Social History  . Marital Status: Single    Spouse Name: N/A  . Number of Children: 2  . Years of Education: N/A   Occupational History  . CNA [employer]   Social History Main Topics  . Smoking status: Never Smoker   . Smokeless tobacco: Never Used  . Alcohol Use: No  . Drug Use: No  . Sexual Activity:    Partners: Male    Birth Control/ Protection: Condom   Other Topics Concern  . Not on file   Social History Narrative   Lives with son.  Daughter lives in [place], [place]  (has 1 grandson). No pets.  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  Exercise - walks, goes to the gym sometimes.  Diet- avoids junk food, tries to eat healthy, small portions.  As of 02/2015     Family History  Problem Relation Age of Onset  . Hypertension Mother   . Anemia Mother     needed blood tranfusions  . Heart disease Mother 67    stent  . Kidney disease Mother     dialysis  . Brain cancer Father   . Schizophrenia Father     paranoid  . Gout Father   . Diabetes Brother   . ADD / ADHD Daughter   . ADD / ADHD Son   . Lung cancer Maternal Grandmother   . Cancer Maternal Grandmother   . Colon cancer Neg Hx   . Breast cancer Neg Hx      Current outpatient prescriptions:  .  famotidine (PEPCID) 20 MG tablet, Take 1 tablet (20 mg total) by mouth at bedtime., Disp: 90 tablet, Rfl: 1 .  fluticasone (FLONASE) 50 MCG/ACT nasal spray, USE TWO SPRAYS INTO THE NOSE DAILY., Disp: 16 g, Rfl: 0 .  SYNTHROID 112 MCG tablet, Take 1 tablet (112 mcg total) by mouth daily before breakfast., Disp: 90 tablet, Rfl: 3 .  dexlansoprazole (DEXILANT) 60 MG capsule, Take 1 capsule (60 mg total) by mouth daily., Disp: 90 capsule, Rfl: 1 .  naproxen (NAPROSYN) 500 MG tablet, Reported on 03/02/2015,  Disp: , Rfl:   Current facility-administered medications:  .  measles, mumps and rubella vaccine (MMR) injection 0.5 mL, 0.5 mL, Subcutaneous, Once, Denita Lung, MD  No Known Allergies    Review of Systems Constitutional: -fever, -chills, -sweats, -unexpected weight change, -decreased appetite, -fatigue Allergy: -sneezing, -itching, -congestion Dermatology: -changing moles, --rash, -lumps ENT: -runny nose, -ear pain, -sore throat, -hoarseness, -sinus pain, -teeth pain, - ringing in ears, -hearing loss, -nosebleeds Cardiology: -chest pain, -palpitations, -swelling, -difficulty breathing when lying flat, -waking up short of breath Respiratory: -cough, -shortness of breath, -difficulty breathing with exercise or exertion, -wheezing, -coughing up blood Gastroenterology: -abdominal pain, -nausea, -vomiting, -diarrhea, -constipation, -blood in stool, -changes in bowel movement, -difficulty  swallowing or eating Hematology: -bleeding, -bruising  Musculoskeletal: -joint aches, -muscle aches, -joint swelling, -back pain, -neck pain, -cramping, -changes in gait Ophthalmology: denies vision changes, eye redness, itching, discharge Urology: -burning with urination, -difficulty urinating, -blood in urine, -urinary frequency, -urgency, -incontinence Neurology: -headache, -weakness, -tingling, +numbness, -memory loss, -falls, -dizziness Psychology: -depressed mood, -agitation, -sleep problems,-retaining/attention issues     Objective:   Physical Exam  BP 100/60 mmHg  Pulse 88  Ht 5' 4.75" (1.645 m)  Wt 214 lb (97.07 kg)  BMI 35.87 kg/m2  SpO2 98%  General appearance: alert, no distress, WD/WN, AA female Skin: scattered macules, no worrisome lesions HEENT: normocephalic, conjunctiva/corneas normal, sclerae anicteric, PERRLA, EOMi, nares patent, no discharge or erythema, pharynx normal Oral cavity: MMM, tongue normal, teeth in good repair Neck: supple, no lymphadenopathy, no thyromegaly, no masses, normal ROM, no bruits Chest: non tender, normal shape and expansion Heart: RRR, normal S1, S2, no murmurs Lungs: CTA bilaterally, no wheezes, rhonchi, or rales Abdomen: +bs, soft, several central and RUQ surgical port scars, lower horizontal scar from prior C section, non tender, non distended, no masses, no hepatomegaly, no splenomegaly, no bruits Back: non tender, normal ROM, no scoliosis Musculoskeletal: upper extremities non tender, no obvious deformity, normal ROM throughout, lower extremities non tender, no obvious deformity, normal ROM throughout Extremities: no edema, no cyanosis, no clubbing Pulses: 2+ symmetric, upper and lower extremities, normal cap refill Neurological: alert, oriented x 3, CN2-12 intact, strength normal upper extremities and lower extremities, sensation normal throughout, DTRs 2+ throughout, no cerebellar signs, gait normal Psychiatric: normal affect,  behavior normal, pleasant  Breast/gyn/rectal - deferred to gyn   Assessment and Plan :    Encounter Diagnoses  Name Primary?  . Encounter for health maintenance examination in adult Yes  . Other specified hypothyroidism   . Impaired fasting blood sugar   . Gastroesophageal reflux disease without esophagitis   . SUI (stress urinary incontinence, female)   . Obesity   . Paresthesia   . Bilateral carpal tunnel syndrome   . Menorrhagia with regular cycle   . Chronic cough    Physical exam - discussed healthy lifestyle, diet, exercise, preventative care, vaccinations, and addressed their concerns.  Handout given. Referral to gyn for consult for heavy periods, possible ablation pending labs, consider NCS/EMG for CTS, c/t QHS wrist splints Obesity - consider medications, f/u pending labs chronic cough, GERD - change to Dexliant, c/t Pepcid.  Impaired glucose - f/u pending labs Hypothyroidism - c/t current medication, reviewed recent labs   Return pending labs.

## 2015-03-03 ENCOUNTER — Other Ambulatory Visit: Payer: Self-pay | Admitting: Medical

## 2015-03-03 MED ORDER — SYNTHROID 112 MCG PO TABS: 112.0000 ug | ORAL_TABLET | Freq: Every day | ORAL | Status: DC

## 2015-03-03 MED ORDER — PHENTERMINE-TOPIRAMATE ER 7.5-46 MG PO CP24: 1.0000 | ORAL_CAPSULE | ORAL | Status: DC

## 2015-03-03 MED ORDER — PHENTERMINE-TOPIRAMATE ER 3.75-23 MG PO CP24: 1.0000 | ORAL_CAPSULE | ORAL | Status: DC

## 2015-03-03 NOTE — Addendum Note (Signed)
Addended by: Kieth Brightly on: 03/03/2015 02:16 PM   Modules accepted: Kipp Brood

## 2015-03-03 NOTE — Addendum Note (Signed)
Addended by: Kalani Baray M on: 03/03/2015 02:16 PM   Modules accepted: SmartSet  

## 2015-03-10 ENCOUNTER — Telehealth: Payer: Self-pay

## 2015-03-10 NOTE — Telephone Encounter (Signed)
Pt scheduled with [facility] 04/19/15 at 9am with Dr. Orlan Leavens

## 2015-04-08 ENCOUNTER — Ambulatory Visit (INDEPENDENT_AMBULATORY_CARE_PROVIDER_SITE_OTHER): Payer: BC Managed Care – PPO | Admitting: Medical

## 2015-04-08 ENCOUNTER — Encounter: Payer: Self-pay | Admitting: Medical

## 2015-04-08 ENCOUNTER — Telehealth: Payer: Self-pay | Admitting: Medical

## 2015-04-08 VITALS — BP 112/72 | HR 105 | Ht 64.75 in | Wt 208.0 lb

## 2015-04-08 DIAGNOSIS — G5603 Carpal tunnel syndrome, bilateral upper limbs: Secondary | ICD-10-CM | POA: Diagnosis not present

## 2015-04-08 DIAGNOSIS — R7301 Impaired fasting glucose: Secondary | ICD-10-CM

## 2015-04-08 DIAGNOSIS — K219 Gastro-esophageal reflux disease without esophagitis: Secondary | ICD-10-CM

## 2015-04-08 DIAGNOSIS — E038 Other specified hypothyroidism: Secondary | ICD-10-CM

## 2015-04-08 DIAGNOSIS — E669 Obesity, unspecified: Secondary | ICD-10-CM

## 2015-04-08 MED ORDER — PHENTERMINE HCL 37.5 MG PO TABS: 37.5000 mg | ORAL_TABLET | Freq: Every day | ORAL | Status: DC

## 2015-04-08 NOTE — Patient Instructions (Signed)
Check insurance coverage for Saxenda and Contrave weight loss medications  Also check insurance formulary for their preferred GERD medication, particularly Protonix

## 2015-04-08 NOTE — Telephone Encounter (Signed)
Pt sent in a fax statement of medical necessity. This is in reference to her being able to use flex spending to have a gym membership. She stated that in addition to the letter we gave her a today's appointment she needed this completed as well.  Am sending back to be completed.

## 2015-04-08 NOTE — Progress Notes (Signed)
Subjective: Chief Complaint  Patient presents with  . Follow-up    weight loss medication, only did the trial. tried to take it but didnt really work out   Since last visit Dexilant at night works great.   nexium never worked for her in the past.  Really like the Dexilant.   Omeprazole doesn't work well.   Dexilant was $90 for 26mo supply.  Only started the 2 week trial of Qsymia.   Insurance wouldn't pay for the Qsymia.  Felt like it gave her stomach pain.  Last visit showed prediabetes and slightly elevated chronic leukocytosis.  Here to discuss labs and other medication options for weight loss.  She is exercise, eating healthy. No other aggravating or relieving factors. No other complaint.   Past Medical History  Diagnosis Date  . Impaired fasting glucose     when at 200 pounds  . GERD (gastroesophageal reflux disease)   . Trichomonas vaginalis infection 04/2011  . Chronic cough   . Allergic rhinitis   . Obesity   . Amenorrhea     since ablation in 2007  . Hypothyroidism age 45  . Hepatitis B immune 10/2014    per titer  . Need for hepatitis C screening test 10/2014    negative screen   Past Surgical History  Procedure Laterality Date  . Tubal ligation    . Endometrial ablation  2006 or 2007  . Cesarean section    . Cholecystectomy  11/2014    ROS as in subjective   Objective: BP 112/72 mmHg  Pulse 105  Ht 5' 4.75" (1.645 m)  Wt 208 lb (94.348 kg)  BMI 34.87 kg/m2  Wt Readings from Last 3 Encounters:  04/08/15 208 lb (94.348 kg)  03/02/15 214 lb (97.07 kg)  10/20/14 207 lb 3.2 oz (93.985 kg)   Gen; wd, wn, nad    Assessment: Encounter Diagnoses  Name Primary?  . Gastroesophageal reflux disease without esophagitis Yes  . Impaired fasting blood sugar   . Obesity   . Bilateral carpal tunnel syndrome   . Other specified hypothyroidism      Plan: GERD - c/t Dexilant, check insurance formulary for options, glad to hear dexilant working well  Impaired  glucose, obesity, reviewed recent labs, discussed diet, weight loss efforts.  Change to short term trial of Phentermine due to cost.  Discussed risks/benefits, proper use.  CTS - c/t plan to see ortho  Hypothyroidism - c/t same medication   Valerya was seen today for follow-up.  Diagnoses and all orders for this visit:  Gastroesophageal reflux disease without esophagitis  Impaired fasting blood sugar  Obesity  Bilateral carpal tunnel syndrome  Other specified hypothyroidism  Other orders -     phentermine (ADIPEX-P) 37.5 MG tablet; Take 1 tablet (37.5 mg total) by mouth daily before breakfast.

## 2015-05-02 ENCOUNTER — Ambulatory Visit (INDEPENDENT_AMBULATORY_CARE_PROVIDER_SITE_OTHER): Payer: BC Managed Care – PPO | Admitting: Medical

## 2015-05-02 ENCOUNTER — Encounter: Payer: Self-pay | Admitting: Medical

## 2015-05-02 VITALS — BP 100/70 | HR 115 | Temp 99.4°F | Resp 20 | Wt 206.0 lb

## 2015-05-02 DIAGNOSIS — R490 Dysphonia: Secondary | ICD-10-CM

## 2015-05-02 DIAGNOSIS — J302 Other seasonal allergic rhinitis: Secondary | ICD-10-CM | POA: Diagnosis not present

## 2015-05-02 DIAGNOSIS — R05 Cough: Secondary | ICD-10-CM | POA: Diagnosis not present

## 2015-05-02 DIAGNOSIS — R059 Cough, unspecified: Secondary | ICD-10-CM

## 2015-05-02 MED ORDER — FLUTICASONE PROPIONATE 50 MCG/ACT NA SUSP: NASAL | Status: AC

## 2015-05-02 MED ORDER — BENZONATATE 200 MG PO CAPS: 200.0000 mg | ORAL_CAPSULE | Freq: Three times a day (TID) | ORAL | Status: DC | PRN

## 2015-05-02 NOTE — Progress Notes (Signed)
Subjective: Chief Complaint  Patient presents with  . Cough    started thursday, taken mucinex used hot tea honey and lemon. her nose is running. clear drainage. losing voice.    Here for cough, hoarse voice, runny nose, since last week.  Has had some nausea with the phlegm combing up.   Typically gets seasonal allergies, but [place] [facility] [place] where the pollen was worse than here flaring up her allergies.  No body aches or chills.   No fever.  No vomiting or diarrhea.  No wheezing or SOB.   Ran out of Flonase.  No other aggravating or relieving factors. No other complaint.  Past Medical History  Diagnosis Date  . Impaired fasting glucose     when at 200 pounds  . GERD (gastroesophageal reflux disease)   . Trichomonas vaginalis infection 04/2011  . Chronic cough   . Allergic rhinitis   . Obesity   . Amenorrhea     since ablation in 2007  . Hypothyroidism age 45  . Hepatitis B immune 10/2014    per titer  . Need for hepatitis C screening test 10/2014    negative screen   ROS as in subjective   Objective: BP 100/70 mmHg  Pulse 115  Temp(Src) 99.4 F (37.4 C) (Tympanic)  Resp 20  Wt 206 lb (93.441 kg)  SpO2 97%  General appearance: alert, no distress, WD/WN, HEENT: normocephalic, sclerae anicteric, TMs pearly, nares with clear discharge, pink swollen turbinates, pharynx normal Oral cavity: MMM, no lesions Neck: supple, no lymphadenopathy, no thyromegaly, no masses Heart: RRR, normal S1, S2, no murmurs Lungs: CTA bilaterally, no wheezes, rhonchi, or rales     Assessment: Encounter Diagnoses  Name Primary?  . Other seasonal allergic rhinitis Yes  . Cough   . Hoarseness of voice      Plan: Restart allergy regimen, consider adding QHS antihistamine OTC.  Gail Bartlett was seen today for cough.  Diagnoses and all orders for this visit:  Other seasonal allergic rhinitis  Cough  Hoarseness of voice  Other orders -     fluticasone (FLONASE) 50 MCG/ACT nasal  spray; USE TWO SPRAYS INTO THE NOSE DAILY. -     benzonatate (TESSALON) 200 MG capsule; Take 1 capsule (200 mg total) by mouth 3 (three) times daily as needed for cough.

## 2015-06-10 ENCOUNTER — Other Ambulatory Visit: Payer: Self-pay | Admitting: Medical

## 2015-06-10 NOTE — Telephone Encounter (Signed)
Is this ok to refill?  

## 2015-08-26 ENCOUNTER — Other Ambulatory Visit: Payer: Self-pay | Admitting: Medical

## 2015-09-02 ENCOUNTER — Other Ambulatory Visit: Payer: Self-pay | Admitting: Medical

## 2015-09-02 ENCOUNTER — Telehealth: Payer: Self-pay | Admitting: Medical

## 2015-09-02 DIAGNOSIS — E039 Hypothyroidism, unspecified: Secondary | ICD-10-CM

## 2015-09-02 NOTE — Telephone Encounter (Signed)
Yes, is ok.  Lab orders in

## 2015-09-02 NOTE — Telephone Encounter (Signed)
Pt coming in for consult on 09/15/15 to discuss her concerns about her thyroid and thyroid meds. She would like to come in prior to that appt on 09/09/15 for full thyroid labs (pt mentioned T3 & T4). Is this ok?

## 2015-09-09 ENCOUNTER — Other Ambulatory Visit: Payer: BC Managed Care – PPO

## 2015-09-09 DIAGNOSIS — E039 Hypothyroidism, unspecified: Secondary | ICD-10-CM

## 2015-09-09 LAB — T4, FREE: Free T4: 1.1 ng/dL (ref 0.8–1.8)

## 2015-09-09 LAB — TSH: TSH: 4.54 mIU/L — ABNORMAL HIGH

## 2015-09-10 LAB — T3: T3, Total: 99 ng/dL (ref 76–181)

## 2015-09-15 ENCOUNTER — Ambulatory Visit (INDEPENDENT_AMBULATORY_CARE_PROVIDER_SITE_OTHER): Payer: BC Managed Care – PPO | Admitting: Medical

## 2015-09-15 ENCOUNTER — Ambulatory Visit
Admission: RE | Admit: 2015-09-15 | Discharge: 2015-09-15 | Disposition: A | Discharge status: Home / Self Care | Payer: BC Managed Care – PPO | Source: Ambulatory Visit | Attending: Medical | Admitting: Medical

## 2015-09-15 ENCOUNTER — Encounter: Payer: Self-pay | Admitting: Medical

## 2015-09-15 VITALS — BP 112/60 | HR 82 | Resp 16 | Wt 211.4 lb

## 2015-09-15 DIAGNOSIS — R05 Cough: Secondary | ICD-10-CM

## 2015-09-15 DIAGNOSIS — K219 Gastro-esophageal reflux disease without esophagitis: Secondary | ICD-10-CM | POA: Diagnosis not present

## 2015-09-15 DIAGNOSIS — E038 Other specified hypothyroidism: Secondary | ICD-10-CM | POA: Diagnosis not present

## 2015-09-15 DIAGNOSIS — R053 Chronic cough: Secondary | ICD-10-CM

## 2015-09-15 MED ORDER — LEVOTHYROXINE SODIUM 100 MCG PO TABS: 100.0000 ug | ORAL_TABLET | Freq: Every day | ORAL | 3 refills | Status: DC

## 2015-09-15 NOTE — Progress Notes (Signed)
Subjective: Chief Complaint  Patient presents with  . Thyroid Problem    discuss doseage for Synthroid.    Here to discuss thyroid labs done the other.   Taking 112 mcg, but went back to the old 183mg tablets she had left.   Been on the 1067m dose the past month.   At the 112 mcg was getting bad cough in the morning, would feel exhausted.  Feels ok on the 10076mdose.   Had been on 112 mcg for 6 months or so.  GERD - taking dexilant without c/o.    Chronic cough - still coughing daily, any time of time.  Uses Tessalon Perles.  An odor can trigger cough, getting hot can trigger cough.  Nonsmoker.  She is allergic to cats and dogs, no animals in the home.  Past Medical History:  Diagnosis Date  . Allergic rhinitis   . Amenorrhea    since ablation in 2007  . Chronic cough   . GERD (gastroesophageal reflux disease)   . Hepatitis B immune 10/2014   per titer  . Hypothyroidism age [age] Impaired fasting glucose    when at 200 pounds  . Need for hepatitis C screening test 10/2014   negative screen  . Obesity   . Trichomonas vaginalis infection 04/2011   Current Outpatient Prescriptions on File Prior to Visit  Medication Sig Dispense Refill  . dexlansoprazole (DEXILANT) 60 MG capsule Take 1 capsule (60 mg total) by mouth daily. 90 capsule 1  . benzonatate (TESSALON) 200 MG capsule Take 1 capsule (200 mg total) by mouth 3 (three) times daily as needed for cough. (Patient not taking: Reported on 09/15/2015) 30 capsule 0  . famotidine (PEPCID) 20 MG tablet Take 1 tablet (20 mg total) by mouth at bedtime. (Patient not taking: Reported on 09/15/2015) 90 tablet 1  . fluticasone (FLONASE) 50 MCG/ACT nasal spray USE TWO SPRAYS INTO THE NOSE DAILY. (Patient not taking: Reported on 09/15/2015) 16 g 11  . SYNTHROID 112 MCG tablet TAKE 1 TABLET (112 MCG TOTAL) DAILY  BEFORE BREAKFAST (Patient not taking: Reported on 09/15/2015) 90 tablet 1   Current Facility-Administered Medications on File Prior to  Visit  Medication Dose Route Frequency Provider Last Rate Last Dose  . measles, mumps and rubella vaccine (MMR) injection 0.5 mL  0.5 mL Subcutaneous Once JohDenita LungD        Family History  Problem Relation Age of Onset  . Hypertension Mother   . Anemia Mother     needed blood tranfusions  . Heart disease Mother 67 61 stent  . Kidney disease Mother     dialysis  . Brain cancer Father   . Schizophrenia Father     paranoid  . Gout Father   . Diabetes Brother   . ADD / ADHD Daughter   . ADD / ADHD Son   . Lung cancer Maternal Grandmother   . Cancer Maternal Grandmother   . Colon cancer Neg Hx   . Breast cancer Neg Hx     ROS as in subjective  Objective: BP 112/60   Pulse 82   Resp 16   Wt 211 lb 6.4 oz (95.9 kg)   SpO2 99%   BMI 35.45 kg/m   General appearance: alert, no distress, WD/WN HEENT: normocephalic, sclerae anicteric, TMs pearly, nares patent, no discharge or erythema, pharynx normal Oral cavity: MMM, no lesions Neck: supple, no lymphadenopathy, no thyromegaly, no masses Heart: RRR, normal S1, S2,  no murmurs Lungs: CTA bilaterally, no wheezes, rhonchi, or rales Ext: no edema Pulses: 2+ symmetric, upper and lower extremities, normal cap refill    Assessment: Encounter Diagnoses  Name Primary?  . Other specified hypothyroidism Yes  . Gastroesophageal reflux disease without esophagitis   . Chronic cough      Plan: Hypothyroidism - reviewed recent labs, c/t 129mg daily as she tolerates this better.  Recheck labs in 3-6 mo  GERD - c/t Dexilant, avoid GERD triggers  Chronic cough - discussed differential.   PFT reviewed.  Go for CXR.  Intisar was seen today for thyroid problem.  Diagnoses and all orders for this visit:  Other specified hypothyroidism  Gastroesophageal reflux disease without esophagitis  Chronic cough -     Spirometry with graph -     DG Chest 2 View; Future  Other orders -     levothyroxine (SYNTHROID) 100 MCG  tablet; Take 1 tablet (100 mcg total) by mouth daily before breakfast.

## 2015-09-26 ENCOUNTER — Other Ambulatory Visit: Payer: Self-pay | Admitting: Medical

## 2015-09-26 DIAGNOSIS — R05 Cough: Secondary | ICD-10-CM

## 2015-09-26 DIAGNOSIS — R053 Chronic cough: Secondary | ICD-10-CM

## 2015-10-19 ENCOUNTER — Encounter: Payer: Self-pay | Admitting: *Deleted

## 2015-10-19 ENCOUNTER — Ambulatory Visit (INDEPENDENT_AMBULATORY_CARE_PROVIDER_SITE_OTHER): Payer: BC Managed Care – PPO | Admitting: Pulmonary Disease

## 2015-10-19 ENCOUNTER — Telehealth: Payer: Self-pay | Admitting: Pulmonary Disease

## 2015-10-19 VITALS — BP 114/72 | HR 90 | Ht 65.0 in | Wt 211.6 lb

## 2015-10-19 DIAGNOSIS — R05 Cough: Secondary | ICD-10-CM

## 2015-10-19 DIAGNOSIS — J309 Allergic rhinitis, unspecified: Secondary | ICD-10-CM | POA: Diagnosis not present

## 2015-10-19 DIAGNOSIS — K219 Gastro-esophageal reflux disease without esophagitis: Secondary | ICD-10-CM

## 2015-10-19 DIAGNOSIS — R053 Chronic cough: Secondary | ICD-10-CM

## 2015-10-19 NOTE — Patient Instructions (Signed)
   Let me know if you have any new breathing problems or questions before your next visit.  We will review your test results at your follow-up.  I will see you back in 4 weeks.  TESTS ORDERED: 1. FULL PFTs on or before follow-up appointment 2. Serum RAST Panel & CBC with differential today 3. Esophagram

## 2015-10-19 NOTE — Telephone Encounter (Signed)
lmtcb x1 for pt. 

## 2015-10-19 NOTE — Progress Notes (Signed)
Subjective:    Patient ID: Gail Bartlett, female    DOB: [birth year], 45 y.o.   MRN: [id number]  HPI Patient seen in 2013 by Dr. Chase Caller for a cough of approximately 18 months duration. Reportedly patient seen by allergy as well. Cough at that time was progressive and worse in the day compared with at night. Reportedly Advair at that time make her cough worse and cough seem to be at least partially relieved by throat lozenges and hot tea. At that time the patient was recommended to undergo methacholine challenge testing to rule out the possibility of asthma but did not undergo testing. She reports her cough has continued since then without change. She reports the cough seems to be worse intermittently. She reports coughs so hard at times that she has trouble with dyspnea. She reports she produces a clear, foamy mucus with her cough chronically. She reports she does have a tickle sometimes in the back of her throat. She does feel like she has to clear her throat chronically. She reports her cough triggers with strong odors and fumes such as gasoline. She still feels like she coughs more during the day. She does wake up occasionally with coughing. She recently took a trip to her home in [place] and was coughing just as much. Denies any wheezing. No other dyspnea otherwise. No chest tightness, pressure, or pain. She does have intermittent sinus congestion and does have intermittent post-nasal drainage. She reports she is compliant with her Dexilant. She still has reflux intermittently. Denies any morning brash water taste. She has refluxed into the back of her throat. Denies any dysphagia or odynophagia. Reports currently she is not taking Pepcid. No fever, chills, or sweats. No breathing problems as a child but did have an allergy to Dogs. She reports she hasn't taken any medication that has helped her cough significantly. Previously used [employer] & hydrocodone cough syrup without any help.  Previously seen by ENT and given a medication that seemed to help transiently. She had a normal EGD in 2014 by Dr. Ardis Hughs.   Review of Systems No rashes or abnormal bruising. She does have joint pain in her bilateral wrists and right knee. A pertinent 14 point review of systems is negative except as per the history of presenting illness.  No Known Allergies  Current Outpatient Prescriptions on File Prior to Visit  Medication Sig Dispense Refill  . benzonatate (TESSALON) 200 MG capsule Take 1 capsule (200 mg total) by mouth 3 (three) times daily as needed for cough. 30 capsule 0  . dexlansoprazole (DEXILANT) 60 MG capsule Take 1 capsule (60 mg total) by mouth daily. 90 capsule 1  . fluticasone (FLONASE) 50 MCG/ACT nasal spray USE TWO SPRAYS INTO THE NOSE DAILY. 16 g 11  . levothyroxine (SYNTHROID) 100 MCG tablet Take 1 tablet (100 mcg total) by mouth daily before breakfast. 90 tablet 3  . famotidine (PEPCID) 20 MG tablet Take 1 tablet (20 mg total) by mouth at bedtime. (Patient not taking: Reported on 10/19/2015) 90 tablet 1   Current Facility-Administered Medications on File Prior to Visit  Medication Dose Route Frequency Provider Last Rate Last Dose  . measles, mumps and rubella vaccine (MMR) injection 0.5 mL  0.5 mL Subcutaneous Once Denita Lung, MD        Past Medical History:  Diagnosis Date  . Allergic rhinitis   . Amenorrhea    since ablation in 2007  . Chronic cough   . GERD (gastroesophageal reflux disease)   .  Hepatitis B immune 10/2014   per titer  . Hypothyroidism age 39  . Impaired fasting glucose    when at 200 pounds  . Need for hepatitis C screening test 10/2014   negative screen  . Obesity   . Trichomonas vaginalis infection 04/2011    Past Surgical History:  Procedure Laterality Date  . CESAREAN SECTION    . CHOLECYSTECTOMY  11/2014  . ENDOMETRIAL ABLATION  2006 or 2007  . ESOPHAGOGASTRODUODENOSCOPY  2014   Normal by Dr. Ardis Hughs  . TUBAL LIGATION       Family History  Problem Relation Age of Onset  . Hypertension Mother   . Anemia Mother     needed blood tranfusions  . Heart disease Mother 72    stent  . Kidney disease Mother     dialysis  . Brain cancer Father   . Schizophrenia Father     paranoid  . Gout Father   . Diabetes Brother   . ADD / ADHD Daughter   . ADD / ADHD Son   . Lung cancer Maternal Grandmother   . Colon cancer Neg Hx   . Breast cancer Neg Hx   . Lung disease Neg Hx   . Rheumatologic disease Neg Hx     Social History   Social History  . Marital status: Single    Spouse name: N/A  . Number of children: 2  . Years of education: N/A   Occupational History  . CNA [employer]   Social History Main Topics  . Smoking status: Passive Smoke Exposure - Never Smoker    Types: Cigarettes  . Smokeless tobacco: Never Used     Comment: Mother as a child  . Alcohol use No  . Drug use: No  . Sexual activity: Yes    Partners: Male    Birth control/ protection: Condom   Other Topics Concern  . None   Social History Narrative   Lives with son.  Daughter lives in [place], [place]  (has 1 grandson). No pets.  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  Exercise - walks, goes to the gym sometimes.  Diet- avoids junk food, tries to eat healthy, small portions.  As of 02/2015      [facility]:   Originally from [place], [place]. She moved to [facility] in 2006. She has previously traveled to [facility], [place], [place], & [place]. No pets currently. No bird, mold, or hot tub exposure. 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 Always had a negative PPD skin test. Previously enjoyed playing volleyball.       Objective:   Physical Exam BP 114/72 (BP Location: Left Arm, Cuff Size: Normal)   Pulse 90   Ht '5\' 5"'  (1.651 m)   Wt 211 lb 9.6 oz (96 kg)   SpO2 99%   BMI 35.21 kg/m  General:  Awake. Alert. No acute distress.  Integument:  Warm & dry. No rash on exposed skin. No bruising. Lymphatics:   No appreciated cervical or supraclavicular lymphadenoapthy. HEENT:  Moist mucus membranes. No oral ulcers. No scleral injection or icterus. Moderate bilateral nasal turbinate swelling with pale mucosa. Cardiovascular:  Regular rate. No edema. No appreciable JVD.  Pulmonary:  Good aeration & clear to auscultation bilaterally. Symmetric chest wall expansion. No accessory muscle use on room air. Abdomen: Soft. Normal bowel sounds. Nondistended. Grossly nontender. Musculoskeletal:  Normal bulk and tone. Hand grip strength 5/5 bilaterally. No joint deformity  or effusion appreciated. Neurological:  CN 2-12 grossly in tact. No meningismus. Moving all 4 extremities equally. Symmetric brachioradialis deep tendon reflexes. Psychiatric:  Mood and affect congruent. Speech normal rhythm, rate & tone.   PFT 09/15/15: FVC 2.91 L (94%) FEV1 2.39 L (95%) FEV1/FVC 0.82 FEF 25-75 2.42 L (89%)  IMAGING CXR PA/LAT 09/15/15 (personally reviewed by me): No focal opacity or mass appreciated. No pleural effusion. Heart normal in size & mediastinum normal in contour.    Assessment & Plan:  45 y.o. female with chronic cough. Cough is likely multifactorial and given the symptomatic response to exposure with inhaled irritants such as fumes and strong odors I am highly suspicious that she has an underlying reactive airways disease or even possibly asthma. Further confounding the patient's symptoms are controlled reflux as well as her allergic rhinitis. Given lack of symptomatic response to cough suppression with Tessalon Perles and hydrocodone cough syrup in the past I am holding off on initiating any inhaler medications or allergy medications at this time. I instructed the patient contact my office if she had any new breathing problems or questions before her next appointment. I also informed her we would review her test results at her follow-up appointment.  1. Chronic Cough: Suspect underlying asthma. Checking full pulmonary  function testing. Holding off on any inhaler medication at this time. 2. GERD: Continuing on Dexilant once daily. Patient counseled to elevate the head of her bed 3-5 inches & avoid eating within 2-3 hours of bedtime. Checking barium swallow/esophagogram. 3. Allergic Rhinitis: Patient instructed to abstain from her Flonase to see if symptoms worsen in any way. She will resume Flonase if this is the case. Checking serum RAST panel & CBC with differential. 4. Follow-up: Patient to return to clinic in 4 weeks.  Sonia Baller Ashok Cordia, M.D. [facility] Critical [facility] Pager:  [phone] After 3pm or if no response, call [phone] 12:16 PM 10/19/15

## 2015-10-20 NOTE — Telephone Encounter (Signed)
I have rescheduled the patients appointments per her request. She is aware that they have been moved to 11/16/2015 9:30 [facility]

## 2015-10-20 NOTE — Telephone Encounter (Signed)
Spoke with pt. She wants to keep her appointment with JN on 11/23/15. The other appointments that are scheduled for 11/23/15 will need to be rescheduled. She has a Esophogram at 9:30am and a PFT at 3pm.  [facility] - can you please help with getting these rescheduled? Thank you.

## 2015-11-16 ENCOUNTER — Ambulatory Visit (HOSPITAL_COMMUNITY)
Admission: RE | Admit: 2015-11-16 | Discharge: 2015-11-16 | Disposition: A | Discharge status: Home / Self Care | Payer: BC Managed Care – PPO | Source: Ambulatory Visit | Attending: Pulmonary Disease | Admitting: Pulmonary Disease

## 2015-11-16 DIAGNOSIS — R053 Chronic cough: Secondary | ICD-10-CM

## 2015-11-16 DIAGNOSIS — R05 Cough: Secondary | ICD-10-CM | POA: Diagnosis present

## 2015-11-16 DIAGNOSIS — K219 Gastro-esophageal reflux disease without esophagitis: Secondary | ICD-10-CM | POA: Insufficient documentation

## 2015-11-16 LAB — PULMONARY FUNCTION TEST
DL/VA % PRED: 121 %
DL/VA: 5.99 ml/min/mmHg/L
DLCO UNC % PRED: 102 %
DLCO UNC: 26.26 ml/min/mmHg
FEF 25-75 Pre: 2.48 L/sec
FEF2575-%Pred-Pre: 91 %
FEV1-%Pred-Pre: 96 %
FEV1-Pre: 2.45 L
FEV1FVC-%Pred-Pre: 99 %
FEV6-%PRED-PRE: 97 %
FEV6-Pre: 2.96 L
FEV6FVC-%PRED-PRE: 102 %
FVC-%Pred-Pre: 95 %
FVC-Pre: 2.97 L
PRE FEV1/FVC RATIO: 82 %
Pre FEV6/FVC Ratio: 100 %
RV % PRED: 88 %
RV: 1.54 L
TLC % pred: 88 %
TLC: 4.59 L

## 2015-11-16 MED ORDER — ALBUTEROL SULFATE (2.5 MG/3ML) 0.083% IN NEBU
2.5000 mg | INHALATION_SOLUTION | Freq: Once | RESPIRATORY_TRACT | Status: AC
Start: 2015-11-16 — End: 2015-11-16
  Administered 2015-11-16: 2.5 mg via RESPIRATORY_TRACT

## 2015-11-23 ENCOUNTER — Encounter: Payer: Self-pay | Admitting: Pulmonary Disease

## 2015-11-23 ENCOUNTER — Encounter (HOSPITAL_COMMUNITY): Payer: BC Managed Care – PPO

## 2015-11-23 ENCOUNTER — Ambulatory Visit (HOSPITAL_COMMUNITY): Payer: BC Managed Care – PPO

## 2015-11-23 ENCOUNTER — Ambulatory Visit (INDEPENDENT_AMBULATORY_CARE_PROVIDER_SITE_OTHER): Payer: BC Managed Care – PPO | Admitting: Pulmonary Disease

## 2015-11-23 VITALS — BP 124/66 | HR 96 | Ht 65.0 in | Wt 217.0 lb

## 2015-11-23 DIAGNOSIS — R05 Cough: Secondary | ICD-10-CM | POA: Diagnosis not present

## 2015-11-23 DIAGNOSIS — K219 Gastro-esophageal reflux disease without esophagitis: Secondary | ICD-10-CM

## 2015-11-23 DIAGNOSIS — J309 Allergic rhinitis, unspecified: Secondary | ICD-10-CM

## 2015-11-23 DIAGNOSIS — R053 Chronic cough: Secondary | ICD-10-CM

## 2015-11-23 NOTE — Patient Instructions (Addendum)
   Call me if you notice any change to your cough or any specific triggers.  We will contact you with the results of your methacholine challenge test.  I will see you back in 3 months or sooner if needed.  TESTS ORDERED: 1. Methacholine Challenge Test

## 2015-11-23 NOTE — Progress Notes (Signed)
Subjective:    Patient ID: Gail Bartlett, female    DOB: 1970-12-25, 45 y.o.   MRN: [id number]  C.C.:  Follow-up for Chronic Cough, GERD, & Allergic Rhinitis.  HPI Chronic Cough:  She reports she continues to have intermittent coughing without an identifiable trigger. She reports her cough was worse after she attempted to do the Albuterol challenge at her PFTs. She is still waking up occasionally coughing at night. Denies any dyspnea.  GERD:  On Dexilant once daily. Since last appointment she has been taking 2 Dexilant pills at night. Normal esophagram. Recommended elevating the head of= her bed at last appointment.  Allergic Rhinitis:  Holiday from Flonase at last appointment. No increase in sinus pressure, pain, or drainage. Reports no symptoms at this time.   Review of Systems No chest tightness, pain or pressure. No fever, chills, or sweats. No rashes or bruising.  No Known Allergies  Current Outpatient Prescriptions on File Prior to Visit  Medication Sig Dispense Refill  . benzonatate (TESSALON) 200 MG capsule Take 1 capsule (200 mg total) by mouth 3 (three) times daily as needed for cough. 30 capsule 0  . dexlansoprazole (DEXILANT) 60 MG capsule Take 1 capsule (60 mg total) by mouth daily. 90 capsule 1  . fluticasone (FLONASE) 50 MCG/ACT nasal spray USE TWO SPRAYS INTO THE NOSE DAILY. 16 g 11  . levothyroxine (SYNTHROID) 100 MCG tablet Take 1 tablet (100 mcg total) by mouth daily before breakfast. 90 tablet 3   Current Facility-Administered Medications on File Prior to Visit  Medication Dose Route Frequency Provider Last Rate Last Dose  . measles, mumps and rubella vaccine (MMR) injection 0.5 mL  0.5 mL Subcutaneous Once Denita Lung, MD        Past Medical History:  Diagnosis Date  . Allergic rhinitis   . Amenorrhea    since ablation in 2007  . Chronic cough   . GERD (gastroesophageal reflux disease)   . Hepatitis B immune 10/2014   per titer  . Hypothyroidism age  65  . Impaired fasting glucose    when at 200 pounds  . Need for hepatitis C screening test 10/2014   negative screen  . Obesity   . Trichomonas vaginalis infection 04/2011    Past Surgical History:  Procedure Laterality Date  . CESAREAN SECTION    . CHOLECYSTECTOMY  11/2014  . ENDOMETRIAL ABLATION  2006 or 2007  . ESOPHAGOGASTRODUODENOSCOPY  2014   Normal by Dr. Ardis Hughs  . TUBAL LIGATION      Family History  Problem Relation Age of Onset  . Hypertension Mother   . Anemia Mother     needed blood tranfusions  . Heart disease Mother 85    stent  . Kidney disease Mother     dialysis  . Brain cancer Father   . Schizophrenia Father     paranoid  . Gout Father   . Diabetes Brother   . ADD / ADHD Daughter   . ADD / ADHD Son   . Lung cancer Maternal Grandmother   . Colon cancer Neg Hx   . Breast cancer Neg Hx   . Lung disease Neg Hx   . Rheumatologic disease Neg Hx     Social History   Social History  . Marital status: Single    Spouse name: N/A  . Number of children: 2  . Years of education: N/A   Occupational History  . [employer]   Social History  Main Topics  . Smoking status: Passive Smoke Exposure - Never Smoker    Types: Cigarettes  . Smokeless tobacco: Never Used     Comment: Mother as a child  . Alcohol use No  . Drug use: No  . Sexual activity: Yes    Partners: Male    Birth control/ protection: Condom   Other Topics Concern  . None   Social History Narrative   Lives with son.  Daughter lives in [place], [place]  (has 1 grandson). No pets.  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  Exercise - walks, goes to the gym sometimes.  Diet- avoids junk food, tries to eat healthy, small portions.  As of 02/2015      [facility]:   Originally from [place], [place]. She moved to [facility] in 2006. She has previously traveled to [facility], [place], [place], & [place]. No pets currently. No bird, mold, or hot tub exposure. 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 Always had a negative PPD skin test. Previously enjoyed playing volleyball.       Objective:   Physical Exam BP 124/66 (BP Location: Left Arm, Cuff Size: Normal)   Pulse 96   Ht '5\' 5"'  (1.651 m)   Wt 217 lb (98.4 kg)   SpO2 95%   BMI 36.11 kg/m  General:  Awake. Alert. No distress.  Integument:  Warm & dry. No rash on exposed skin.  Lymphatics:  No appreciated cervical or supraclavicular lymphadenoapthy. HEENT:  Continued nasal turbinate swelling with pale mucosa. No oral ulcers. Moist membranes. Cardiovascular:  Regular rate. No edema. Normal S1 & S2. Pulmonary:  No accessory muscle use on room air. Speaking in complete sentences. Clear with auscultation. Continues to have intermittent coughing with a clear mucus.  Abdomen: Soft. Normal bowel sounds. Nontender.  PFT 11/16/15: FVC 2.97 L (95%) FEV1 2.45 L (96%) FEV1/FVC 0.82 FEF 25-75 2.48 L (91%) TLC 4.59 L (80%) RV 80% ERV 63% DLCO uncorrected 102% 09/15/15: FVC 2.91 L (94%) FEV1 2.39 L (95%) FEV1/FVC 0.82 FEF 25-75 2.42 L (89%)  IMAGING ESOPHAGRAM 11/16/15 (per radiologist):  Normal exam.  CXR PA/LAT 09/15/15 (previously reviewed by me): No focal opacity or mass appreciated. No pleural effusion. Heart normal in size & mediastinum normal in contour.    Assessment & Plan:  45 y.o. female with chronic cough.This could certainly represent cough variant asthma. Her symptoms did not worsen with discontinuation of her intranasal steroid therapy therefore I am somewhat skeptical about a postnasal drainage contribution to her chronic cough. Her spirometry remains stable and with normal lung volumes as well as carbon monoxide diffusion capacity I do not believe she has a chronic obstructive process but could certainly have an intermittent reactive airways disease/asthma. Her reflux seems to be well-controlled given a normal esophagogram. I instructed the patient contact my office if she had any new breathing problems or  questions before her next appointment.  1. Chronic Cough: Checking methacholine challenge test for possible cough variant asthma. If negative patient will need referral to [facility]. 2. GERD: Continuing Dexilant with appropriate dietary and lifestyle modifications. No changes. 3. Allergic Rhinitis: Continuing to hold on Flonase. Holding on further serum testing at this time. 4. Health Maintenance:  S/P Tdap February 2014 & Influenza Vaccine September 2017. 5. Follow-up: Patient to return to clinic in 3 months or sooner if needed.  Sonia Baller Ashok Cordia, M.D. [facility]monary & Critical Care Pager:  [phone] After 3pm or  if no response, call [phone] 5:08 PM 11/23/15

## 2015-11-30 ENCOUNTER — Ambulatory Visit (HOSPITAL_COMMUNITY)
Admission: RE | Admit: 2015-11-30 | Discharge: 2015-11-30 | Disposition: A | Discharge status: Home / Self Care | Payer: BC Managed Care – PPO | Source: Ambulatory Visit | Attending: Pulmonary Disease | Admitting: Pulmonary Disease

## 2015-11-30 DIAGNOSIS — J449 Chronic obstructive pulmonary disease, unspecified: Secondary | ICD-10-CM | POA: Insufficient documentation

## 2015-11-30 DIAGNOSIS — R05 Cough: Secondary | ICD-10-CM | POA: Diagnosis present

## 2015-11-30 DIAGNOSIS — R053 Chronic cough: Secondary | ICD-10-CM

## 2015-11-30 LAB — PULMONARY FUNCTION TEST
FEF 25-75 POST: 2.95 L/s
FEF 25-75 Pre: 2.64 L/sec
FEF2575-%CHANGE-POST: 11 %
FEF2575-%PRED-POST: 108 %
FEF2575-%PRED-PRE: 97 %
FEV1-%Change-Post: 5 %
FEV1-%Pred-Post: 102 %
FEV1-%Pred-Pre: 97 %
FEV1-Post: 2.6 L
FEV1-Pre: 2.46 L
FEV1FVC-%CHANGE-POST: 6 %
FEV1FVC-%Pred-Pre: 97 %
FEV6-%CHANGE-POST: 0 %
FEV6-%Pred-Post: 99 %
FEV6-%Pred-Pre: 99 %
FEV6-PRE: 3.04 L
FEV6-Post: 3.02 L
FEV6FVC-%PRED-PRE: 102 %
FEV6FVC-%Pred-Post: 102 %
FVC-%Change-Post: 0 %
FVC-%Pred-Post: 97 %
FVC-%Pred-Pre: 97 %
FVC-POST: 3.02 L
FVC-Pre: 3.04 L
POST FEV1/FVC RATIO: 86 %
PRE FEV1/FVC RATIO: 81 %
Post FEV6/FVC ratio: 100 %
Pre FEV6/FVC Ratio: 100 %

## 2015-11-30 MED ORDER — METHACHOLINE 0.25 MG/ML NEB SOLN
2.0000 mL | Freq: Once | RESPIRATORY_TRACT | Status: AC
  Administered 2015-11-30: 0.5 mg via RESPIRATORY_TRACT

## 2015-11-30 MED ORDER — ALBUTEROL SULFATE (2.5 MG/3ML) 0.083% IN NEBU
2.5000 mg | INHALATION_SOLUTION | Freq: Once | RESPIRATORY_TRACT | Status: AC
  Administered 2015-11-30: 2.5 mg via RESPIRATORY_TRACT

## 2015-11-30 MED ORDER — METHACHOLINE 1 MG/ML NEB SOLN
2.0000 mL | Freq: Once | RESPIRATORY_TRACT | Status: AC
  Administered 2015-11-30: 2 mg via RESPIRATORY_TRACT

## 2015-11-30 MED ORDER — METHACHOLINE 0.0625 MG/ML NEB SOLN
2.0000 mL | Freq: Once | RESPIRATORY_TRACT | Status: AC
  Administered 2015-11-30: 0.125 mg via RESPIRATORY_TRACT

## 2015-11-30 MED ORDER — METHACHOLINE 16 MG/ML NEB SOLN
2.0000 mL | Freq: Once | RESPIRATORY_TRACT | Status: AC
  Administered 2015-11-30: 32 mg via RESPIRATORY_TRACT

## 2015-11-30 MED ORDER — SODIUM CHLORIDE 0.9 % IN NEBU
3.0000 mL | INHALATION_SOLUTION | Freq: Once | RESPIRATORY_TRACT | Status: AC
  Administered 2015-11-30: 3 mL via RESPIRATORY_TRACT

## 2015-11-30 MED ORDER — METHACHOLINE 4 MG/ML NEB SOLN
2.0000 mL | Freq: Once | RESPIRATORY_TRACT | Status: AC
  Administered 2015-11-30: 8 mg via RESPIRATORY_TRACT

## 2015-12-02 ENCOUNTER — Other Ambulatory Visit: Payer: Self-pay

## 2015-12-02 DIAGNOSIS — R05 Cough: Secondary | ICD-10-CM

## 2015-12-02 DIAGNOSIS — R053 Chronic cough: Secondary | ICD-10-CM

## 2015-12-07 ENCOUNTER — Ambulatory Visit (INDEPENDENT_AMBULATORY_CARE_PROVIDER_SITE_OTHER)
Admission: RE | Admit: 2015-12-07 | Discharge: 2015-12-07 | Disposition: A | Discharge status: Home / Self Care | Payer: BC Managed Care – PPO | Source: Ambulatory Visit | Attending: Pulmonary Disease | Admitting: Pulmonary Disease

## 2015-12-07 DIAGNOSIS — R05 Cough: Secondary | ICD-10-CM | POA: Diagnosis not present

## 2015-12-07 DIAGNOSIS — R053 Chronic cough: Secondary | ICD-10-CM

## 2015-12-27 ENCOUNTER — Other Ambulatory Visit: Payer: BC Managed Care – PPO

## 2015-12-27 ENCOUNTER — Encounter: Payer: Self-pay | Admitting: Medical

## 2015-12-27 ENCOUNTER — Ambulatory Visit (INDEPENDENT_AMBULATORY_CARE_PROVIDER_SITE_OTHER): Payer: BC Managed Care – PPO | Admitting: Medical

## 2015-12-27 VITALS — BP 118/82 | HR 90 | Wt 214.8 lb

## 2015-12-27 DIAGNOSIS — R053 Chronic cough: Secondary | ICD-10-CM

## 2015-12-27 DIAGNOSIS — M222X1 Patellofemoral disorders, right knee: Secondary | ICD-10-CM | POA: Diagnosis not present

## 2015-12-27 DIAGNOSIS — R05 Cough: Secondary | ICD-10-CM

## 2015-12-27 DIAGNOSIS — R29898 Other symptoms and signs involving the musculoskeletal system: Secondary | ICD-10-CM

## 2015-12-27 NOTE — Progress Notes (Signed)
Subjective: Chief Complaint  Patient presents with  . rt knee pain     rt poping pain since last friday , no pain    Here for right knee issue.  Started Friday with popping, can hear popping when she walks.   No injury, no fall, no trauma.   Walks a lot at work.   Been working 5 days per week vs 3 days per week of late.    Sometime gets stiffness with the knee, but has new popping.  No leg numbness or tingling.  No pain, just loud popping,  Last year she went to a few sessions of PT 07/2014 after having knee pain and some questionable changes on xray.   She notes that the PT was expensive and didn't help much.  No other aggravating or relieving factors. No other complaint.   Past Medical History:  Diagnosis Date  . Allergic rhinitis   . Amenorrhea    since ablation in 2007  . Chronic cough   . GERD (gastroesophageal reflux disease)   . Hepatitis B immune 10/2014   per titer  . Hypothyroidism age 68  . Impaired fasting glucose    when at 200 pounds  . Need for hepatitis C screening test 10/2014   negative screen  . Obesity   . Trichomonas vaginalis infection 04/2011   Past Surgical History:  Procedure Laterality Date  . CESAREAN SECTION    . CHOLECYSTECTOMY  11/2014  . ENDOMETRIAL ABLATION  2006 or 2007  . ESOPHAGOGASTRODUODENOSCOPY  2014   Normal by Dr. Ardis Hughs  . TUBAL LIGATION      Current Outpatient Prescriptions on File Prior to Visit  Medication Sig Dispense Refill  . dexlansoprazole (DEXILANT) 60 MG capsule Take 1 capsule (60 mg total) by mouth daily. 90 capsule 1  . fluticasone (FLONASE) 50 MCG/ACT nasal spray USE TWO SPRAYS INTO THE NOSE DAILY. 16 g 11  . levothyroxine (SYNTHROID) 100 MCG tablet Take 1 tablet (100 mcg total) by mouth daily before breakfast. 90 tablet 3   Current Facility-Administered Medications on File Prior to Visit  Medication Dose Route Frequency Provider Last Rate Last Dose  . measles, mumps and rubella vaccine (MMR) injection 0.5 mL  0.5 mL  Subcutaneous Once Denita Lung, MD       ROS as in subjective    Objective: BP 118/82   Pulse 90   Wt 214 lb 12.8 oz (97.4 kg)   SpO2 99%   BMI 35.74 kg/m   Wt Readings from Last 3 Encounters:  12/27/15 214 lb 12.8 oz (97.4 kg)  11/23/15 217 lb (98.4 kg)  10/19/15 211 lb 9.6 oz (96 kg)   Gen: wd, wn ,nad Skin: unremarkable, no redness or bruising of right knee or legs MSK: right lateral longitudinal patellar retinaculum with popping and grinding felt with knee ROM, but non tender.  Occasional pop heard when extending right knee.  No joint line tenderness.  No obvious laxity, no effusion, non tender throughout knee.  otherwise right knee ROM normal.  Hip and ankle non tender, normal ROM.   Rest of bilat leg exam unremarkable No leg edema Legs neurovascularly intact  Reviewed xray right knee from 07/2014: IMPRESSION: There is no acute bony abnormality. There is minimal osteoarthritic change. Given the chronic and now progressive symptoms, MRI of the knee is recommended.    Assessment: Encounter Diagnoses  Name Primary?  . Patellofemoral disorder of right knee Yes  . Popping sound of knee joint  Plan: discussed findings, 2016 xray.   Discussed home exercising including stretching daily, using controlled 1 legged squats and leg presses without weight, 30 reps x 3 sets daily or 3-4 days per week at least.   Wear OTC neoprene knee sleeve x 2 weeks.  If not much improve in 3-4 weeks, then recheck.

## 2015-12-30 LAB — RESPIRATORY CULTURE OR RESPIRATORY AND SPUTUM CULTURE: ORGANISM ID, BACTERIA: NORMAL

## 2016-01-25 LAB — FUNGUS CULTURE W SMEAR

## 2016-02-06 ENCOUNTER — Other Ambulatory Visit: Payer: Self-pay | Admitting: Medical

## 2016-02-10 LAB — AFB CULTURE WITH SMEAR (NOT AT ARMC)

## 2016-03-05 ENCOUNTER — Ambulatory Visit (INDEPENDENT_AMBULATORY_CARE_PROVIDER_SITE_OTHER): Payer: BC Managed Care – PPO | Admitting: Medical

## 2016-03-05 ENCOUNTER — Other Ambulatory Visit (HOSPITAL_COMMUNITY)
Admission: RE | Admit: 2016-03-05 | Discharge: 2016-03-05 | Disposition: A | Discharge status: Home / Self Care | Payer: BC Managed Care – PPO | Source: Ambulatory Visit | Attending: Family Medicine | Admitting: Family Medicine

## 2016-03-05 ENCOUNTER — Encounter: Payer: Self-pay | Admitting: Medical

## 2016-03-05 VITALS — BP 110/68 | Ht 65.5 in | Wt 218.6 lb

## 2016-03-05 DIAGNOSIS — Z124 Encounter for screening for malignant neoplasm of cervix: Secondary | ICD-10-CM

## 2016-03-05 DIAGNOSIS — Z01419 Encounter for gynecological examination (general) (routine) without abnormal findings: Secondary | ICD-10-CM | POA: Diagnosis present

## 2016-03-05 DIAGNOSIS — L910 Hypertrophic scar: Secondary | ICD-10-CM

## 2016-03-05 DIAGNOSIS — N63 Unspecified lump in unspecified breast: Secondary | ICD-10-CM

## 2016-03-05 DIAGNOSIS — R7301 Impaired fasting glucose: Secondary | ICD-10-CM | POA: Diagnosis not present

## 2016-03-05 DIAGNOSIS — M21611 Bunion of right foot: Secondary | ICD-10-CM

## 2016-03-05 DIAGNOSIS — E669 Obesity, unspecified: Secondary | ICD-10-CM | POA: Diagnosis not present

## 2016-03-05 DIAGNOSIS — K219 Gastro-esophageal reflux disease without esophagitis: Secondary | ICD-10-CM

## 2016-03-05 DIAGNOSIS — Z0000 Encounter for general adult medical examination without abnormal findings: Secondary | ICD-10-CM | POA: Diagnosis not present

## 2016-03-05 DIAGNOSIS — E039 Hypothyroidism, unspecified: Secondary | ICD-10-CM

## 2016-03-05 DIAGNOSIS — Z113 Encounter for screening for infections with a predominantly sexual mode of transmission: Secondary | ICD-10-CM | POA: Insufficient documentation

## 2016-03-05 DIAGNOSIS — N912 Amenorrhea, unspecified: Secondary | ICD-10-CM

## 2016-03-05 DIAGNOSIS — M8569 Other cyst of bone, multiple sites: Secondary | ICD-10-CM

## 2016-03-05 DIAGNOSIS — Z1239 Encounter for other screening for malignant neoplasm of breast: Secondary | ICD-10-CM

## 2016-03-05 DIAGNOSIS — Z1231 Encounter for screening mammogram for malignant neoplasm of breast: Secondary | ICD-10-CM

## 2016-03-05 LAB — HEMOGLOBIN A1C
Hgb A1c MFr Bld: 5.5 % (ref <5.7)
Mean Plasma Glucose: 111 mg/dL

## 2016-03-05 LAB — CBC
HEMATOCRIT: 39 % (ref 35.0–45.0)
Hemoglobin: 12.9 g/dL (ref 11.7–15.5)
MCH: 27.2 pg (ref 27.0–33.0)
MCHC: 33.1 g/dL (ref 32.0–36.0)
MCV: 82.3 fL (ref 80.0–100.0)
MPV: 9.7 fL (ref 7.5–12.5)
Platelets: 278 10*3/uL (ref 140–400)
RBC: 4.74 MIL/uL (ref 3.80–5.10)
RDW: 14.7 % (ref 11.0–15.0)
WBC: 11.3 10*3/uL — ABNORMAL HIGH (ref 4.0–10.5)

## 2016-03-05 LAB — TSH: TSH: 5.87 mIU/L — ABNORMAL HIGH

## 2016-03-05 LAB — COMPREHENSIVE METABOLIC PANEL
ALT: 18 U/L (ref 6–29)
AST: 17 U/L (ref 10–35)
Albumin: 3.8 g/dL (ref 3.6–5.1)
Alkaline Phosphatase: 96 U/L (ref 33–115)
BUN: 15 mg/dL (ref 7–25)
CALCIUM: 9.1 mg/dL (ref 8.6–10.2)
CO2: 24 mmol/L (ref 20–31)
Chloride: 107 mmol/L (ref 98–110)
Creat: 1 mg/dL (ref 0.50–1.10)
GLUCOSE: 79 mg/dL (ref 65–99)
POTASSIUM: 4 mmol/L (ref 3.5–5.3)
Sodium: 138 mmol/L (ref 135–146)
Total Bilirubin: 0.3 mg/dL (ref 0.2–1.2)
Total Protein: 6.8 g/dL (ref 6.1–8.1)

## 2016-03-05 LAB — POCT URINALYSIS DIPSTICK
Bilirubin, UA: NEGATIVE
Blood, UA: NEGATIVE
Glucose, UA: NEGATIVE
KETONES UA: NEGATIVE
LEUKOCYTES UA: NEGATIVE
NITRITE UA: NEGATIVE
PH UA: 6.5
PROTEIN UA: NEGATIVE
Spec Grav, UA: 1.025
Urobilinogen, UA: NEGATIVE

## 2016-03-05 LAB — T4, FREE: FREE T4: 1.3 ng/dL (ref 0.8–1.8)

## 2016-03-05 NOTE — Progress Notes (Signed)
Subjective:   HPI  Gail Bartlett is a 46 y.o. female who presents for a complete physical.  Of note, she was late for her appt today, and gave the front office some backtalk, not apologetic.     Chief Complaint  Patient presents with  . physical    physical , pap , look at bunion on foot.   Concerns: Gyn hx/o Hx/o 2 pregnancies, 2 live births.   No period since 2007 since ablation surgery.    Hx/o tubal ligation and ablation surgery.  Diet  - pretty good.   Not doing well with exercise currently.  Had a trainer last year, but ended up changing gyms.   Signed up for [employer] last month but hasn't been yet.  Still working long hours.   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  Has 2 hour commute.     Has tender area at bunion of right foot.   Saw ortho recent for tendonitis but forgot to mention the foot.  Goes back to ortho in a month  Reviewed their medical, surgical, family, social, medication, and allergy history and updated chart as appropriate.  Past Medical History:  Diagnosis Date  . Allergic rhinitis   . Amenorrhea    since ablation in 2007  . Chronic cough   . GERD (gastroesophageal reflux disease)   . Hepatitis B immune 10/2014   per titer  . Hypothyroidism age 73  . Impaired fasting glucose    when at 200 pounds  . Need for hepatitis C screening test 10/2014   negative screen  . Obesity   . Trichomonas vaginalis infection 04/2011    Past Surgical History:  Procedure Laterality Date  . CESAREAN SECTION    . CHOLECYSTECTOMY  11/2014  . ENDOMETRIAL ABLATION  2006 or 2007  . ESOPHAGOGASTRODUODENOSCOPY  2014   Normal by Dr. Ardis Hughs  . TUBAL LIGATION      Social History   Social History  . Marital status: Single    Spouse name: N/A  . Number of children: 2  . Years of education: N/A   Occupational History  . CNA [employer]   Social History Main Topics  . Smoking status: Passive Smoke Exposure - Never Smoker    Types:  Cigarettes  . Smokeless tobacco: Never Used     Comment: Mother as a child  . Alcohol use No  . Drug use: No  . Sexual activity: Yes    Partners: Male    Birth control/ protection: Condom   Other Topics Concern  . Not on file   Social History Narrative   Lives with son.  Daughter lives in [place], [place]  (has 1 grandson). No pets.  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  Exercise - walks, goes to the gym sometimes.  Diet- avoids junk food, tries to eat healthy, small portions.  As of 02/2016      Originally from [place], [place]. She moved to [place] [facility] in 2006. She has previously traveled to [facility], [place], [place], & [place]. No pets currently. No bird, mold, or hot tub exposure. 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 Always had a negative PPD skin test. Previously enjoyed playing volleyball.     Family History  Problem Relation Age of Onset  . Hypertension Mother   . Anemia Mother     needed blood tranfusions  . Heart disease Mother 69    stent  .  Kidney disease Mother     dialysis  . Brain cancer Father   . Schizophrenia Father     paranoid  . Gout Father   . Diabetes Brother   . ADD / ADHD Daughter   . ADD / ADHD Son   . Lung cancer Maternal Grandmother   . Cancer Maternal Aunt   . Colon cancer Neg Hx   . Breast cancer Neg Hx   . Lung disease Neg Hx   . Rheumatologic disease Neg Hx      Current Outpatient Prescriptions:  .  DEXILANT 60 MG capsule, TAKE ONE CAPSULE BY MOUTH ONCE DAILY, Disp: 90 capsule, Rfl: 1 .  fluticasone (FLONASE) 50 MCG/ACT nasal spray, USE TWO SPRAYS INTO THE NOSE DAILY., Disp: 16 g, Rfl: 11 .  levothyroxine (SYNTHROID) 100 MCG tablet, Take 1 tablet (100 mcg total) by mouth daily before breakfast., Disp: 90 tablet, Rfl: 3  Current Facility-Administered Medications:  .  measles, mumps and rubella vaccine (MMR) injection 0.5 mL, 0.5 mL, Subcutaneous, Once, Denita Lung, MD  No Known Allergies   Review of Systems Constitutional:  -fever, -chills, -sweats, -unexpected weight change, -decreased appetite, -fatigue Allergy: -sneezing, -itching, -congestion Dermatology: -changing moles, --rash, -lumps ENT: -runny nose, -ear pain, -sore throat, -hoarseness, -sinus pain, -teeth pain, - ringing in ears, -hearing loss, -nosebleeds Cardiology: -chest pain, -palpitations, -swelling, -difficulty breathing when lying flat, -waking up short of breath Respiratory: -cough, -shortness of breath, -difficulty breathing with exercise or exertion, -wheezing, -coughing up blood Gastroenterology: -abdominal pain, -nausea, -vomiting, -diarrhea, -constipation, -blood in stool, -changes in bowel movement, -difficulty swallowing or eating Hematology: -bleeding, -bruising  Musculoskeletal: -joint aches, -muscle aches, -joint swelling, -back pain, -neck pain, -cramping, -changes in gait Ophthalmology: denies vision changes, eye redness, itching, discharge Urology: -burning with urination, -difficulty urinating, -blood in urine, -urinary frequency, -urgency, -incontinence Neurology: -headache, -weakness, -tingling, +numbness, -memory loss, -falls, -dizziness Psychology: -depressed mood, -agitation, -sleep problems,-retaining/attention issues     Objective:   Physical Exam  BP 110/68   Ht 5' 5.5" (1.664 m)   Wt 218 lb 9.6 oz (99.2 kg)   LMP 11/12/2015   BMI 35.82 kg/m   General appearance: alert, no distress, WD/WN, AA female Skin: scattered macules, no worrisome lesions HEENT: normocephalic, conjunctiva/corneas normal, sclerae anicteric, PERRLA, EOMi, nares patent, no discharge or erythema, pharynx normal Oral cavity: MMM, tongue normal, teeth in good repair Neck: supple, no lymphadenopathy, no thyromegaly, no masses, normal ROM, no bruits Chest: non tender, normal shape and expansion Heart: RRR, normal S1, S2, no murmurs Lungs: CTA bilaterally, no wheezes, rhonchi, or rales Abdomen: +bs, soft, several central and RUQ surgical port scars  that have become keloid tissue, lower horizontal scar from prior C section, non tender, non distended, no masses, no hepatomegaly, no splenomegaly, no bruits Back: non tender, normal ROM, no scoliosis Musculoskeletal: upper extremities non tender, no obvious deformity, normal ROM throughout, lower extremities non tender, no obvious deformity, normal ROM throughout Extremities: no edema, no cyanosis, no clubbing Pulses: 2+ symmetric, upper and lower extremities, normal cap refill Neurological: alert, oriented x 3, CN2-12 intact, strength normal upper extremities and lower extremities, sensation normal throughout, DTRs 2+ throughout, no cerebellar signs, gait normal Psychiatric: normal affect, behavior normal, pleasant   Breast: nontender, right breast with density at 7 oclock laterally, 1.5-2 cm diameter, left upper breast at 2 oclock with fibrous density, otherwise no masses or lumps, no skin changes, no nipple discharge or inversion, no axillary lymphadenopathy Gyn: Normal external genitalia  without lesions, vagina with normal mucosa, cervix without lesions, no cervical motion tenderness, no abnormal vaginal discharge.  Uterus and adnexa not enlarged, nontender, no masses.  Pap performed.  Exam chaperoned by nurse. Rectal: deferred    Assessment and Plan :    Encounter Diagnoses  Name Primary?  . Encounter for health maintenance examination in adult Yes  . Hypothyroidism, unspecified type   . Gastroesophageal reflux disease, esophagitis presence not specified   . Impaired fasting blood sugar   . Obesity with serious comorbidity, unspecified classification, unspecified obesity type   . Screening for cervical cancer   . Screening for breast cancer   . Amenorrhea   . Breast lump   . Bunion of great toe of right foot   . Cyst of bone, localized   . Keloid of skin    Physical exam - discussed healthy lifestyle, diet, exercise, preventative care, vaccinations, and addressed their concerns.   Handout given. Routine labs today Impaired glucose - f/u pending labs Hypothyroidism - c/t current medication, reviewed recent labs Go for mammogram, diagnostic given palpable findings today Pap sent See your eye doctor yearly for routine vision care. See your dentist yearly for routine dental care including hygiene visits twice yearly. Foot pain, bunions, likely cyst of foot - f/u with ortho, avoid tight fitting footwear Keloid - referral to dermatology  Cecily was seen today for physical.  Diagnoses and all orders for this visit:  Encounter for health maintenance examination in adult -     Comprehensive metabolic panel -     CBC -     Lipid panel -     TSH -     Hemoglobin A1c -     T4, free -     Cytology - PAP -     Cancel: MM DIGITAL SCREENING BILATERAL; Future -     FSH/LH -     Cancel: MM Digital Diagnostic Bilat; Future -     Urinalysis Dipstick  Hypothyroidism, unspecified type -     TSH -     T4, free -     FSH/LH  Gastroesophageal reflux disease, esophagitis presence not specified  Impaired fasting blood sugar -     Hemoglobin A1c  Obesity with serious comorbidity, unspecified classification, unspecified obesity type  Screening for cervical cancer -     Cytology - PAP  Screening for breast cancer -     Cancel: MM DIGITAL SCREENING BILATERAL; Future -     Cancel: MM Digital Diagnostic Bilat; Future  Amenorrhea -     FSH/LH  Breast lump -     Cancel: MM Digital Diagnostic Bilat; Future -     MM DIAG BREAST TOMO BILATERAL; Future -     US BREAST LTD UNI LEFT INC AXILLA; Future -     US BREAST LTD UNI RIGHT INC AXILLA; Future  Bunion of great toe of right foot  Cyst of bone, localized  Keloid of skin -     Ambulatory referral to Dermatology

## 2016-03-06 LAB — FSH/LH
FSH: 3 m[IU]/mL
LH: 3 m[IU]/mL

## 2016-03-07 ENCOUNTER — Other Ambulatory Visit: Payer: Self-pay | Admitting: Medical

## 2016-03-08 ENCOUNTER — Telehealth: Payer: Self-pay

## 2016-03-08 NOTE — Telephone Encounter (Signed)
Where is this form so I can notify pt?

## 2016-03-08 NOTE — Telephone Encounter (Signed)
I had already given Gail Bartlett the form showing location of palpable lumps and this is suppose to be bilat diagnostic mammogram

## 2016-03-08 NOTE — Telephone Encounter (Signed)
Called pt to review lab results. She states she called to schedule mammogram, but they were unsure which mammogram to schedule.   Does pt need just normal routine mammo or dx?

## 2016-03-09 ENCOUNTER — Other Ambulatory Visit: Payer: Self-pay | Admitting: Medical

## 2016-03-09 LAB — CYTOLOGY - PAP: DIAGNOSIS: NEGATIVE

## 2016-03-09 MED ORDER — METRONIDAZOLE 500 MG PO TABS: 500.0000 mg | ORAL_TABLET | Freq: Three times a day (TID) | ORAL | 0 refills | Status: DC

## 2016-03-09 NOTE — Telephone Encounter (Signed)
This was already set up and sent out to [facility]

## 2016-03-12 ENCOUNTER — Telehealth: Payer: Self-pay

## 2016-03-12 NOTE — Telephone Encounter (Signed)
Pt states she wants a callback from YOU about lab results. She does not want to speak with anyone but you about them. Please call her [phone]. Trixie Rude/RLB

## 2016-03-13 NOTE — Telephone Encounter (Signed)
Gail Bartlett - I spoke to patient.  Please call the cytology lab to have them re-look at the pap.  She notes she hasn't had sex in 2 years, was shocked to hear about the trichomonas infection.   I just want to double check a slide wasn't labeled wrong or the wrong result got sent inadvertently.

## 2016-03-14 ENCOUNTER — Other Ambulatory Visit: Payer: BC Managed Care – PPO

## 2016-03-14 ENCOUNTER — Ambulatory Visit
Admission: RE | Admit: 2016-03-14 | Discharge: 2016-03-14 | Disposition: A | Discharge status: Home / Self Care | Payer: BC Managed Care – PPO | Source: Ambulatory Visit | Attending: Medical | Admitting: Medical

## 2016-03-14 ENCOUNTER — Other Ambulatory Visit: Payer: Self-pay | Admitting: Medical

## 2016-03-14 DIAGNOSIS — N63 Unspecified lump in unspecified breast: Secondary | ICD-10-CM

## 2016-03-14 DIAGNOSIS — N6320 Unspecified lump in the left breast, unspecified quadrant: Secondary | ICD-10-CM

## 2016-03-14 NOTE — Telephone Encounter (Signed)
Check with lab there was not mix up at the the lab ,per the lab tech. She that it was looked at under microscope  And it was seen that way , if she didn't have a test just for that in past then it wouldn't have ever show up.   There was not mix up.

## 2016-03-14 NOTE — Telephone Encounter (Signed)
We called and lab verified.  She may have had this lying dormant for a while.   Please have her take the medication.  If she wants she can recheck in 3-4 wk to make sure it has resolved

## 2016-03-15 NOTE — Telephone Encounter (Signed)
Called and spoke pt about this , she is taking the meds for this, she is going to call us back in 3-4 weeks to set appt.

## 2016-03-20 ENCOUNTER — Other Ambulatory Visit: Payer: Self-pay | Admitting: Medical

## 2016-03-20 DIAGNOSIS — N6320 Unspecified lump in the left breast, unspecified quadrant: Secondary | ICD-10-CM

## 2016-03-21 ENCOUNTER — Ambulatory Visit
Admission: RE | Admit: 2016-03-21 | Discharge: 2016-03-21 | Disposition: A | Discharge status: Home / Self Care | Payer: BC Managed Care – PPO | Source: Ambulatory Visit | Attending: Medical | Admitting: Medical

## 2016-03-21 DIAGNOSIS — N6320 Unspecified lump in the left breast, unspecified quadrant: Secondary | ICD-10-CM

## 2016-03-21 HISTORY — PX: BREAST BIOPSY: SHX20

## 2016-03-22 ENCOUNTER — Encounter: Payer: Self-pay | Admitting: Internal Medicine

## 2016-03-23 ENCOUNTER — Telehealth: Payer: Self-pay | Admitting: Medical

## 2016-03-23 NOTE — Telephone Encounter (Signed)
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  No evidence of cancer.  She may already know the result, but wanted to make sure.

## 2016-03-26 NOTE — Telephone Encounter (Signed)
Called pt, she is aware

## 2016-03-28 ENCOUNTER — Ambulatory Visit (INDEPENDENT_AMBULATORY_CARE_PROVIDER_SITE_OTHER): Payer: BC Managed Care – PPO | Admitting: Medical

## 2016-03-28 ENCOUNTER — Encounter: Payer: Self-pay | Admitting: Medical

## 2016-03-28 VITALS — BP 116/68 | HR 83 | Wt 221.0 lb

## 2016-03-28 DIAGNOSIS — A599 Trichomoniasis, unspecified: Secondary | ICD-10-CM

## 2016-03-28 LAB — POCT WET PREP (WET MOUNT)
Clue Cells Wet Prep Whiff POC: NEGATIVE
KOH Wet Prep POC: NEGATIVE
Trichomonas Wet Prep HPF POC: ABSENT

## 2016-03-28 NOTE — Progress Notes (Signed)
Subjective: Chief Complaint  Patient presents with  . follow up pap    folloq up pap std    Here for f/u.  I saw her recent for physical, and unfortunately swabs showed + trichomonas although she had not been sexually active in about 2 years.   No recent new sexual partners.   Has no symptoms, here to f/u swab to show 1 week of metronidazole resolved infection completley.  Past Medical History:  Diagnosis Date  . Allergic rhinitis   . Amenorrhea    since ablation in 2007  . Chronic cough   . GERD (gastroesophageal reflux disease)   . Hepatitis B immune 10/2014   per titer  . Hypothyroidism age 40  . Impaired fasting glucose    when at 200 pounds  . Need for hepatitis C screening test 10/2014   negative screen  . Obesity   . Trichomonas vaginalis infection 04/2011   Current Outpatient Prescriptions on File Prior to Visit  Medication Sig Dispense Refill  . DEXILANT 60 MG capsule TAKE ONE CAPSULE BY MOUTH ONCE DAILY 90 capsule 1  . fluticasone (FLONASE) 50 MCG/ACT nasal spray USE TWO SPRAYS INTO THE NOSE DAILY. 16 g 11  . levothyroxine (SYNTHROID) 100 MCG tablet Take 1 tablet (100 mcg total) by mouth daily before breakfast. 90 tablet 3   Current Facility-Administered Medications on File Prior to Visit  Medication Dose Route Frequency Provider Last Rate Last Dose  . measles, mumps and rubella vaccine (MMR) injection 0.5 mL  0.5 mL Subcutaneous Once Denita Lung, MD       ROS as in subjective   Objective: BP 116/68   Pulse 83   Wt 221 lb (100.2 kg)   SpO2 98%   BMI 36.22 kg/m    Gen: wd, wn, nad Gyn: Normal external genitalia without lesions, vagina with normal mucosa, cervix without lesions, no cervical motion tenderness, no abnormal vaginal discharge.   Exam chaperoned by nurse. Rectal: deferred   Assessment: Encounter Diagnosis  Name Primary?  . Trichomonal infection Yes    Plan: Wet prep normal.  Reassured, we have successful treatment.  discussed safe sex.   F/u prn.

## 2016-04-09 ENCOUNTER — Other Ambulatory Visit: Payer: BC Managed Care – PPO

## 2016-04-11 ENCOUNTER — Other Ambulatory Visit: Payer: BC Managed Care – PPO

## 2016-04-11 DIAGNOSIS — Z0000 Encounter for general adult medical examination without abnormal findings: Secondary | ICD-10-CM

## 2016-04-11 LAB — LIPID PANEL
CHOL/HDL RATIO: 2.3 ratio (ref <5.0)
CHOLESTEROL: 162 mg/dL (ref <200)
HDL: 72 mg/dL (ref >50)
LDL Cholesterol: 71 mg/dL (ref <100)
Triglycerides: 93 mg/dL (ref <150)
VLDL: 19 mg/dL (ref <30)

## 2016-05-16 ENCOUNTER — Encounter: Payer: Self-pay | Admitting: Medical

## 2016-05-16 ENCOUNTER — Ambulatory Visit (INDEPENDENT_AMBULATORY_CARE_PROVIDER_SITE_OTHER): Payer: BC Managed Care – PPO | Admitting: Medical

## 2016-05-16 VITALS — BP 110/72 | HR 64 | Wt 212.2 lb

## 2016-05-16 DIAGNOSIS — M545 Low back pain: Secondary | ICD-10-CM

## 2016-05-16 LAB — POCT URINALYSIS DIPSTICK
BILIRUBIN UA: NEGATIVE
Blood, UA: NEGATIVE
Glucose, UA: NEGATIVE
KETONES UA: NEGATIVE
LEUKOCYTES UA: NEGATIVE
Nitrite, UA: NEGATIVE
Protein, UA: NEGATIVE
SPEC GRAV UA: 1.02 (ref 1.010–1.025)
Urobilinogen, UA: NEGATIVE E.U./dL — AB
pH, UA: 6.5 (ref 5.0–8.0)

## 2016-05-16 MED ORDER — IBUPROFEN 800 MG PO TABS: 800.0000 mg | ORAL_TABLET | Freq: Three times a day (TID) | ORAL | 0 refills | Status: DC | PRN

## 2016-05-16 MED ORDER — CYCLOBENZAPRINE HCL 10 MG PO TABS: ORAL_TABLET | ORAL | 0 refills | Status: DC

## 2016-05-16 NOTE — Progress Notes (Signed)
Subjective: Chief Complaint  Patient presents with  . Back Pain    lower back pain going into right leg  x3days   Here for right buttock and leg pain, down to toes.   Sometimes feels like right foot's toes are going numb.  Pain started 3 days ago.  Worse pain Saturday while at work, but no specific injury, fall or trauma.  Has hip/low back pain, but mainly right leg and buttock pain.  Pain is achy.   Constant pain.    Feels muscle achy like one has been doing a lot of exercise, soreness.   No fever.  No urinary changes, no blood in urine or stool.  Has chronic hx/o urinary incontinence, but nothing new or worse.  Has used some OTC Ibuprofen, aleve.  In general hasn't been exercising but is active at work, always moving.  Doesn't stretch regularly.  Is a CNA.  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  But then had 1 day where she had to sit with a patient all day.   No other aggravating or relieving factors. No other complaint.  Past Medical History:  Diagnosis Date  . Allergic rhinitis   . Amenorrhea    since ablation in 2007  . Chronic cough   . GERD (gastroesophageal reflux disease)   . Hepatitis B immune 10/2014   per titer  . Hypothyroidism age 89  . Impaired fasting glucose    when at 200 pounds  . Need for hepatitis C screening test 10/2014   negative screen  . Obesity   . Trichomonas vaginalis infection 04/2011   Current Outpatient Prescriptions on File Prior to Visit  Medication Sig Dispense Refill  . DEXILANT 60 MG capsule TAKE ONE CAPSULE BY MOUTH ONCE DAILY 90 capsule 1  . fluticasone (FLONASE) 50 MCG/ACT nasal spray USE TWO SPRAYS INTO THE NOSE DAILY. 16 g 11  . levothyroxine (SYNTHROID) 100 MCG tablet Take 1 tablet (100 mcg total) by mouth daily before breakfast. 90 tablet 3   Current Facility-Administered Medications on File Prior to Visit  Medication Dose Route Frequency Provider Last Rate Last Dose  . measles, mumps and rubella vaccine (MMR) injection 0.5  mL  0.5 mL Subcutaneous Once Denita Lung, MD       Past Surgical History:  Procedure Laterality Date  . CESAREAN SECTION    . CHOLECYSTECTOMY  11/2014  . ENDOMETRIAL ABLATION  2006 or 2007  . ESOPHAGOGASTRODUODENOSCOPY  2014   Normal by Dr. Ardis Hughs  . TUBAL LIGATION     Review of Systems Constitutional: -fever, -chills, -sweats, -unexpected weight change,-fatigue ENT: -runny nose, -ear pain, -sore throat Cardiology:  -chest pain, -palpitations, -edema Respiratory: -cough, -shortness of breath, -wheezing Gastroenterology: -abdominal pain, -nausea, -vomiting, -diarrhea, -constipation Hematology: -bleeding or bruising problems Musculoskeletal: -arthralgias, +myalgias, -joint swelling, +back pain Ophthalmology: -vision changes Urology: -dysuria, -difficulty urinating, -hematuria, -urinary frequency, -urgency Neurology: -headache, -weakness, +tingling, -numbness    Objective: BP 110/72   Pulse 64   Wt 212 lb 3.2 oz (96.3 kg)   SpO2 98%   BMI 34.77 kg/m   General appearance: alert, no distress, WD/WN, obese AA female Back: tender bilat low lumbar paraspinal region, mild pain with back flexion and extension, no scoliosis, ROM is full but with mild pain Musculoskeletal: tender along right buttock and sciatic notch, tender mildly in right thing in general, but no joint swelling, no deformity, normal leg, hip, knee ROM.  she does have some inbowing of both knees (genu  verum).   Nontender, no swelling, no obvious deformity Extremities: no edema, no cyanosis, no clubbing Pulses: 2+ symmetric, upper and lower extremities, normal cap refill Neurological: strength normal upper extremities and lower extremities, sensation normal throughout, DTRs 2+ throughout, no cerebellar signs, gait normal Psychiatric: normal affect, behavior normal, pleasant     Assessment: Encounter Diagnosis  Name Primary?  . Low back pain, unspecified back pain laterality, unspecified chronicity, with sciatica  presence unspecified Yes    Plan: Likely acute inflammation of sciatica and spasm of muscles in low back, leg muscle strain from a lot of recent walking.   advised relative rest, no heavy lifting in the next few days, do use stretching as discussed, stay active, avoid prolonged sitting or standing without moving.   Begin flexeril prn the next few nights then prn, begin ibuprofen TID the next few days, then prn.   Consider massage, can use heat topically.   If not much improved within the next 5-7 days then recheck.   Yannet was seen today for back pain.  Diagnoses and all orders for this visit:  Low back pain, unspecified back pain laterality, unspecified chronicity, with sciatica presence unspecified -     Urinalysis Dipstick  Other orders -     cyclobenzaprine (FLEXERIL) 10 MG tablet; 1 tablet po QHS prn for back spasm, or up to BID, caution sedation -     ibuprofen (ADVIL,MOTRIN) 800 MG tablet; Take 1 tablet (800 mg total) by mouth every 8 (eight) hours as needed.

## 2016-05-25 ENCOUNTER — Ambulatory Visit: Payer: BC Managed Care – PPO | Admitting: Medical

## 2016-05-25 ENCOUNTER — Other Ambulatory Visit: Payer: Self-pay | Admitting: Medical

## 2016-05-25 ENCOUNTER — Telehealth: Payer: Self-pay | Admitting: Medical

## 2016-05-25 MED ORDER — CYCLOBENZAPRINE HCL 10 MG PO TABS: ORAL_TABLET | ORAL | 0 refills | Status: DC

## 2016-05-25 MED ORDER — IBUPROFEN 800 MG PO TABS: 800.0000 mg | ORAL_TABLET | Freq: Three times a day (TID) | ORAL | 0 refills | Status: DC | PRN

## 2016-05-25 NOTE — Telephone Encounter (Signed)
Pt just cancelled her appointment for this afternoon because she is leaving work now and need to sleep because she has to return to work this evening so she will be asleep during her appointment time. Rescheduled for Monday.   Requesting muscle relaxer and ibuprofen refills to last until her appointment on Monday since she is still in pain.

## 2016-05-28 ENCOUNTER — Ambulatory Visit (INDEPENDENT_AMBULATORY_CARE_PROVIDER_SITE_OTHER): Payer: BC Managed Care – PPO | Admitting: Medical

## 2016-05-28 ENCOUNTER — Encounter: Payer: Self-pay | Admitting: Medical

## 2016-05-28 VITALS — BP 118/74 | HR 96 | Wt 213.2 lb

## 2016-05-28 DIAGNOSIS — M791 Myalgia: Secondary | ICD-10-CM | POA: Diagnosis not present

## 2016-05-28 DIAGNOSIS — M5441 Lumbago with sciatica, right side: Secondary | ICD-10-CM | POA: Diagnosis not present

## 2016-05-28 DIAGNOSIS — M7918 Myalgia, other site: Secondary | ICD-10-CM

## 2016-05-28 DIAGNOSIS — M62838 Other muscle spasm: Secondary | ICD-10-CM

## 2016-05-28 NOTE — Progress Notes (Signed)
Subjective: Chief Complaint  Patient presents with  . Back Pain    having pain going from butt into her leg x 2 weeks   Here for recheck on right buttock and right leg pain that radiates down to toes.   I saw her 05/16/16 for same.   From last visit sometimes right foot toes feel numb. No specific injury, fall or trauma.  Has hip/low back pain, but mainly right leg and buttock pain.  Pain is achy.   Constant pain.    Feels muscle achy like one has been doing a lot of exercise, soreness.     This past week was worse pain in the buttocks more so than hip and back.   yesterday worse than today.  Uses the muscle relaxer at night, but knocks her out.  Using the ibuprofen TID.    No fever.  No urinary changes, no blood in urine or stool.  Has chronic hx/o urinary incontinence, but nothing new or worse.  Has used some OTC Ibuprofen, aleve.  In general hasn't been exercising but is active at work, always moving.  Doesn't stretch regularly.  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  No other aggravating or relieving factors. No other complaint.  Past Medical History:  Diagnosis Date  . Allergic rhinitis   . Amenorrhea    since ablation in 2007  . Chronic cough   . GERD (gastroesophageal reflux disease)   . Hepatitis B immune 10/2014   per titer  . Hypothyroidism age 49  . Impaired fasting glucose    when at 200 pounds  . Need for hepatitis C screening test 10/2014   negative screen  . Obesity   . Trichomonas vaginalis infection 04/2011   Current Outpatient Prescriptions on File Prior to Visit  Medication Sig Dispense Refill  . cyclobenzaprine (FLEXERIL) 10 MG tablet 1 tablet po QHS prn for back spasm, or up to BID, caution sedation 12 tablet 0  . DEXILANT 60 MG capsule TAKE ONE CAPSULE BY MOUTH ONCE DAILY 90 capsule 1  . fluticasone (FLONASE) 50 MCG/ACT nasal spray USE TWO SPRAYS INTO THE NOSE DAILY. 16 g 11  . ibuprofen (ADVIL,MOTRIN) 800 MG tablet Take 1  tablet (800 mg total) by mouth every 8 (eight) hours as needed. 30 tablet 0  . levothyroxine (SYNTHROID) 100 MCG tablet Take 1 tablet (100 mcg total) by mouth daily before breakfast. 90 tablet 3   Current Facility-Administered Medications on File Prior to Visit  Medication Dose Route Frequency Provider Last Rate Last Dose  . measles, mumps and rubella vaccine (MMR) injection 0.5 mL  0.5 mL Subcutaneous Once Denita Lung, MD       Past Surgical History:  Procedure Laterality Date  . CESAREAN SECTION    . CHOLECYSTECTOMY  11/2014  . ENDOMETRIAL ABLATION  2006 or 2007  . ESOPHAGOGASTRODUODENOSCOPY  2014   Normal by Dr. Ardis Hughs  . TUBAL LIGATION     Review of Systems Constitutional: -fever, -chills, -sweats, -unexpected weight change,-fatigue ENT: -runny nose, -ear pain, -sore throat Cardiology:  -chest pain, -palpitations, -edema Respiratory: -cough, -shortness of breath, -wheezing Gastroenterology: -abdominal pain, -nausea, -vomiting, -diarrhea, -constipation Hematology: -bleeding or bruising problems Musculoskeletal: -arthralgias, +myalgias, -joint swelling, +back pain Ophthalmology: -vision changes Urology: -dysuria, -difficulty urinating, -hematuria, -urinary frequency, -urgency Neurology: -headache, -weakness, +tingling, -numbness    Objective: BP 118/74   Pulse 96   Wt 213 lb 3.2 oz (96.7 kg)   SpO2  97%   BMI 34.94 kg/m   General appearance: alert, no distress, WD/WN, obese AA female Back: tender bilat low lumbar paraspinal region, mild pain with back flexion and extension, no scoliosis, ROM is full but with mild pain Musculoskeletal: tender along right and left mid to upper buttock , otherwise nontender, no joint swelling, no deformity, normal leg, hip, knee ROM.  she does have some in bowing of both knees (genu verum).   Nontender, no swelling, no obvious deformity Extremities: no edema, no cyanosis, no clubbing Pulses: 2+ symmetric, upper and lower extremities, normal  cap refill Neurological: strength normal upper extremities and lower extremities, sensation normal throughout, DTRs 2+ throughout, no cerebellar signs, gait normal Psychiatric: normal affect, behavior normal, pleasant     Assessment: Encounter Diagnoses  Name Primary?  . Acute bilateral low back pain with right-sided sciatica Yes  . Buttock pain   . Muscle spasm      Plan: No worse findings.  clinically improved a little.   Advised and demonstrated several stretches.   She declines physical therapy referral.  Recommended massage therapy.  Can c/t Ibuprofen BID - TID, can c/t flexeril QHS prn, heat.   Stay active.  Avoid prolonged sitting or standing.  Take breaks and stretch throughout the day.   If not much improved within the next 1-2 weeks, consider imaging of L spine and right hip vs referral to PT or ortho  Jillien was seen today for back pain.  Diagnoses and all orders for this visit:  Acute bilateral low back pain with right-sided sciatica  Buttock pain  Muscle spasm

## 2016-05-28 NOTE — Patient Instructions (Signed)
Massage Therapy:  Janet Blevins [facility] [address] [place], [place] [address] [phone] [email or web address]   Tiffany Wright [facility] [address] [place], [place] [address] [phone]   

## 2016-06-06 ENCOUNTER — Other Ambulatory Visit: Payer: Self-pay | Admitting: Medical

## 2016-06-06 ENCOUNTER — Telehealth: Payer: Self-pay | Admitting: Medical

## 2016-06-06 DIAGNOSIS — M545 Low back pain, unspecified: Secondary | ICD-10-CM | POA: Insufficient documentation

## 2016-06-06 DIAGNOSIS — M25551 Pain in right hip: Secondary | ICD-10-CM

## 2016-06-06 DIAGNOSIS — M5440 Lumbago with sciatica, unspecified side: Secondary | ICD-10-CM

## 2016-06-06 MED ORDER — TRAMADOL HCL 50 MG PO TABS: 50.0000 mg | ORAL_TABLET | Freq: Four times a day (QID) | ORAL | 0 refills | Status: DC | PRN

## 2016-06-06 NOTE — Telephone Encounter (Signed)
Xray orders are in.

## 2016-06-06 NOTE — Telephone Encounter (Signed)
At this point, can try Ultram for pain.  Call out, and refer to physical therapy.   Also if desired, we can check xray of L spine and hip either now or after she has done some therapy

## 2016-06-06 NOTE — Telephone Encounter (Signed)
Called in ultram to [facility] in [place]. Pt states she wants to do the xrays and will do it when she gets back in town. She can not afford PT right now and will hold off. Please put in orders for Xrays

## 2016-06-06 NOTE — Telephone Encounter (Signed)
Pt called and wants to know if you could send her something else for pain states that the flexeril and ibuprofen is not helping, states she is not any better, she has a very hard time sitting down, she is out of town right now, pt would [facility] to the [facility] on [address], [place], [place] [address] She can be reached at [phone]

## 2016-07-26 ENCOUNTER — Other Ambulatory Visit: Payer: Self-pay | Admitting: Medical

## 2016-11-09 ENCOUNTER — Ambulatory Visit (INDEPENDENT_AMBULATORY_CARE_PROVIDER_SITE_OTHER): Payer: BC Managed Care – PPO | Admitting: Medical

## 2016-11-09 ENCOUNTER — Ambulatory Visit
Admission: RE | Admit: 2016-11-09 | Discharge: 2016-11-09 | Disposition: A | Discharge status: Home / Self Care | Payer: BC Managed Care – PPO | Source: Ambulatory Visit | Attending: Medical | Admitting: Medical

## 2016-11-09 VITALS — BP 110/68 | HR 95 | Wt 217.6 lb

## 2016-11-09 DIAGNOSIS — M7918 Myalgia, other site: Secondary | ICD-10-CM | POA: Diagnosis not present

## 2016-11-09 DIAGNOSIS — M5440 Lumbago with sciatica, unspecified side: Secondary | ICD-10-CM | POA: Diagnosis not present

## 2016-11-09 DIAGNOSIS — M25551 Pain in right hip: Secondary | ICD-10-CM

## 2016-11-09 NOTE — Progress Notes (Signed)
Subjective: Chief Complaint  Patient presents with  . Back Pain    still having back pain since april    Here for recheck on back and buttock pain.  I saw her for same in 05/2016.  She notes no real improvement. She never went for xrays.   She walks a lot at work, and at times sits for hours.   But no major improvement or worse symptoms compared to 05/2016.  She states that she works too much to exercise.   Does try some stretching from time to time.  No specific injury, fall or trauma.  Has hip/low back pain, but mainly right leg and buttock pain.  Pain is achy.   Constant pain.    Feels muscle achy like one has been doing a lot of exercise, soreness.   Lately worse buttock right sided pain.  No fever.  No urinary changes, no blood in urine or stool.  Is a CNA.  No other aggravating or relieving factors. No other complaint.  Past Medical History:  Diagnosis Date  . Allergic rhinitis   . Amenorrhea    since ablation in 2007  . Chronic cough   . GERD (gastroesophageal reflux disease)   . Hepatitis B immune 10/2014   per titer  . Hypothyroidism age 8  . Impaired fasting glucose    when at 200 pounds  . Need for hepatitis C screening test 10/2014   negative screen  . Obesity   . Trichomonas vaginalis infection 04/2011   Current Outpatient Prescriptions on File Prior to Visit  Medication Sig Dispense Refill  . cyclobenzaprine (FLEXERIL) 10 MG tablet 1 tablet po QHS prn for back spasm, or up to BID, caution sedation 12 tablet 0  . DEXILANT 60 MG capsule TAKE ONE CAPSULE BY MOUTH ONCE DAILY 90 capsule 1  . fluticasone (FLONASE) 50 MCG/ACT nasal spray USE TWO SPRAYS INTO THE NOSE DAILY. 16 g 11  . SYNTHROID 100 MCG tablet TAKE 1 TABLET (100 MCG) DAILY BEFORE BREAKFAST 90 tablet 0   Current Facility-Administered Medications on File Prior to Visit  Medication Dose Route Frequency Provider Last Rate Last Dose  . measles, mumps and rubella vaccine (MMR) injection 0.5 mL  0.5 mL Subcutaneous Once  Denita Lung, MD       Past Surgical History:  Procedure Laterality Date  . CESAREAN SECTION    . CHOLECYSTECTOMY  11/2014  . ENDOMETRIAL ABLATION  2006 or 2007  . ESOPHAGOGASTRODUODENOSCOPY  2014   Normal by Dr. Ardis Hughs  . TUBAL LIGATION     Review of Systems Constitutional: -fever, -chills, -sweats, -unexpected weight change,-fatigue ENT: -runny nose, -ear pain, -sore throat Cardiology:  -chest pain, -palpitations, -edema Respiratory: -cough, -shortness of breath, -wheezing Gastroenterology: -abdominal pain, -nausea, -vomiting, -diarrhea, -constipation Hematology: -bleeding or bruising problems Musculoskeletal: -arthralgias, +myalgias, -joint swelling, +back pain Ophthalmology: -vision changes Urology: -dysuria, -difficulty urinating, -hematuria, -urinary frequency, -urgency Neurology: -headache, -weakness, +tingling, -numbness    Objective: BP 110/68   Pulse 95   Wt 217 lb 9.6 oz (98.7 kg)   SpO2 99%   BMI 35.66 kg/m   Wt Readings from Last 3 Encounters:  11/09/16 217 lb 9.6 oz (98.7 kg)  05/28/16 213 lb 3.2 oz (96.7 kg)  05/16/16 212 lb 3.2 oz (96.3 kg)    General appearance: alert, no distress, WD/WN, obese AA female Back: tender bilat low lumbar paraspinal region, mild pain with back flexion and extension, no scoliosis, ROM is full but with mild pain Musculoskeletal:  tender along right and left mid to upper buttock , otherwise non tender, no joint swelling, no deformity, normal leg, hip, knee ROM.  she does have some in bowing of both knees (genu verum).   Nontender, no swelling, no obvious deformity Extremities: no edema, no cyanosis, no clubbing Pulses: 2+ symmetric, upper and lower extremities, normal cap refill Neurological: strength normal upper extremities and lower extremities, sensation normal throughout, DTRs 2+ throughout, no cerebellar signs, gait normal Psychiatric: normal affect, behavior normal, pleasant     Assessment: Encounter Diagnoses  Name  Primary?  . Low back pain with sciatica, sciatica laterality unspecified, unspecified back pain laterality, unspecified chronicity Yes  . Right buttock pain      Plan: Discussed possible causes of her symptoms.   Reviewed last visit for same.   Advised and demonstrated several stretches.  Stay active.  Avoid prolonged sitting or standing.  Take breaks and stretch throughout the day.   Go for xrays as discussed last visit.  Consider PT vs chiropractic vs other therapy.   Weight loss would be most helpful as well.  Kharter was seen today for back pain.  Diagnoses and all orders for this visit:  Low back pain with sciatica, sciatica laterality unspecified, unspecified back pain laterality, unspecified chronicity  Right buttock pain

## 2016-11-20 ENCOUNTER — Other Ambulatory Visit: Payer: Self-pay

## 2016-11-20 ENCOUNTER — Other Ambulatory Visit: Payer: Self-pay | Admitting: Medical

## 2016-11-20 ENCOUNTER — Telehealth: Payer: Self-pay | Admitting: Medical

## 2016-11-20 DIAGNOSIS — M545 Low back pain: Secondary | ICD-10-CM

## 2016-11-20 DIAGNOSIS — R7989 Other specified abnormal findings of blood chemistry: Secondary | ICD-10-CM

## 2016-11-20 NOTE — Telephone Encounter (Signed)
Labs are ordered 

## 2016-11-20 NOTE — Telephone Encounter (Signed)
Pt would like to come in tomorrow for thyroid labs then come in for an appointment next week to see [name] for a follow up/Med Ck on her thyroid. Due to some symptoms that she is having she would like to make sure that [name] checks her T3 & T4. Can she come in for labs only tomorrow?

## 2016-11-21 ENCOUNTER — Other Ambulatory Visit: Payer: BC Managed Care – PPO

## 2016-11-21 DIAGNOSIS — R7989 Other specified abnormal findings of blood chemistry: Secondary | ICD-10-CM

## 2016-11-21 NOTE — Telephone Encounter (Signed)
Pt called back informed and made an appt for today

## 2016-11-22 ENCOUNTER — Telehealth: Payer: Self-pay

## 2016-11-22 ENCOUNTER — Other Ambulatory Visit: Payer: Self-pay

## 2016-11-22 ENCOUNTER — Other Ambulatory Visit: Payer: Self-pay | Admitting: Medical

## 2016-11-22 DIAGNOSIS — E039 Hypothyroidism, unspecified: Secondary | ICD-10-CM

## 2016-11-22 LAB — TSH: TSH: 11.42 m[IU]/L — AB

## 2016-11-22 LAB — T3: T3 TOTAL: 70 ng/dL — AB (ref 76–181)

## 2016-11-22 LAB — T4, FREE: Free T4: 1.2 ng/dL (ref 0.8–1.8)

## 2016-11-22 MED ORDER — LEVOTHYROXINE SODIUM 112 MCG PO TABS: 112.0000 ug | ORAL_TABLET | Freq: Every day | ORAL | 2 refills | Status: DC

## 2016-11-22 MED ORDER — LEVOTHYROXINE SODIUM 112 MCG PO TABS
112.0000 ug | ORAL_TABLET | Freq: Every day | ORAL | 2 refills | Status: DC
Start: 2016-11-22 — End: 2017-02-21

## 2016-11-22 NOTE — Telephone Encounter (Signed)
Error

## 2016-12-21 ENCOUNTER — Ambulatory Visit: Payer: BC Managed Care – PPO | Admitting: Medical

## 2017-01-31 IMAGING — RF DG ESOPHAGUS
7 of 9 series · 14 of 20 positions shown · non-contrast
Comparison: 09/19/2012.

CLINICAL DATA: Chronic cough.

EXAM:
ESOPHOGRAM / BARIUM SWALLOW / BARIUM TABLET STUDY
TECHNIQUE: Combined double contrast and single contrast examination performed
using effervescent crystals, thick barium liquid, and thin barium
liquid. The patient was observed with fluoroscopy swallowing a 13 mm
barium sulphate tablet.
FLUOROSCOPY TIME:  Fluoroscopy Time:  1 minutes and 20 seconds.
Radiation Exposure Index (if provided by the fluoroscopic device):
Number of Acquired Spot Images: 0

[Series 1: fluoro_barium 2fps_bw · 0.18mm/px · 3 of 8 frames shown (1 of 4)]
[frame 2/8]
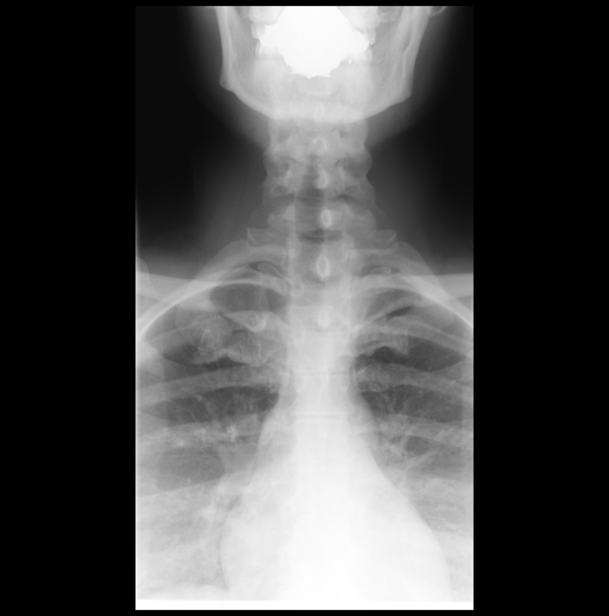
[frame 6/8]
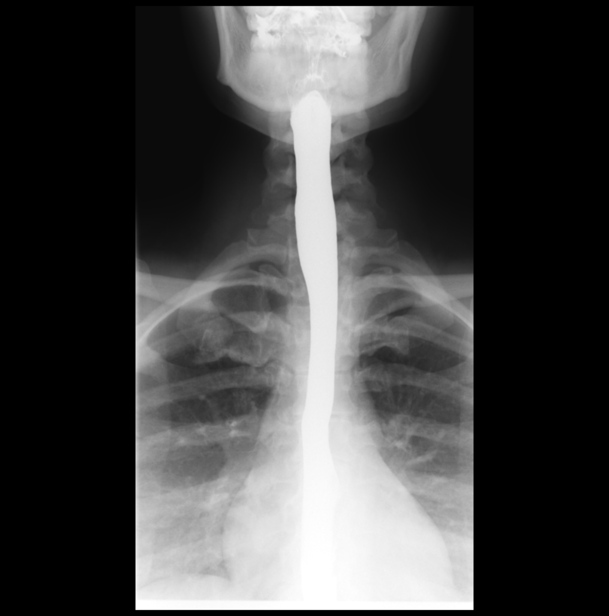
[frame 7/8]
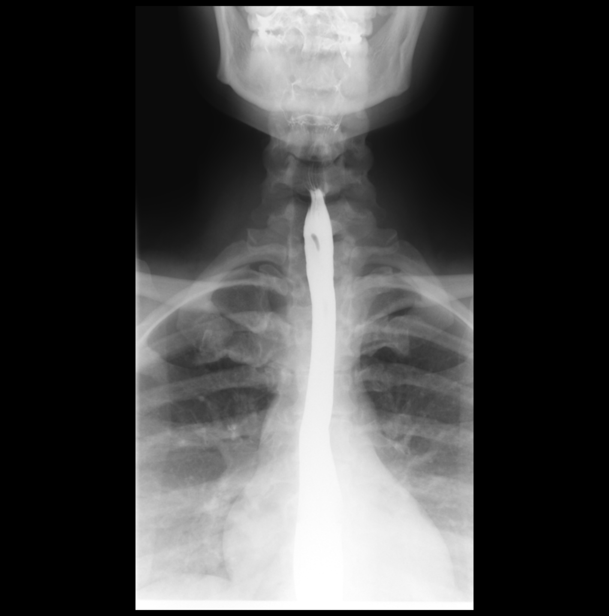

[Series 2: fluoro_barium 2fps_bw · 0.18mm/px · 2 of 11 frames shown (2 of 4)]
[frame 6/11]
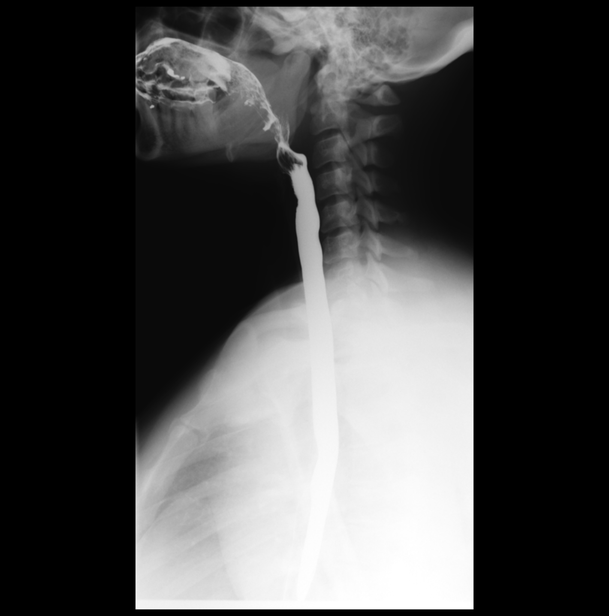
[frame 10/11]
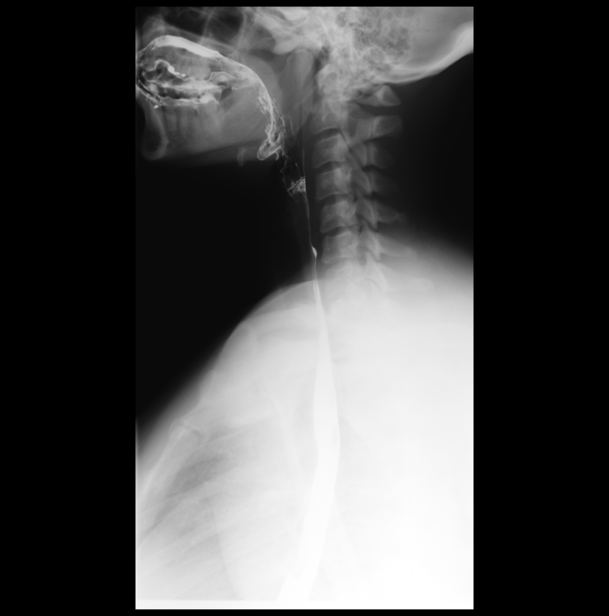

[Series 3: fluoro_barium 2fps_bw · 0.19mm/px · 3 of 34 frames shown (3 of 4)]
[frame 6/34]
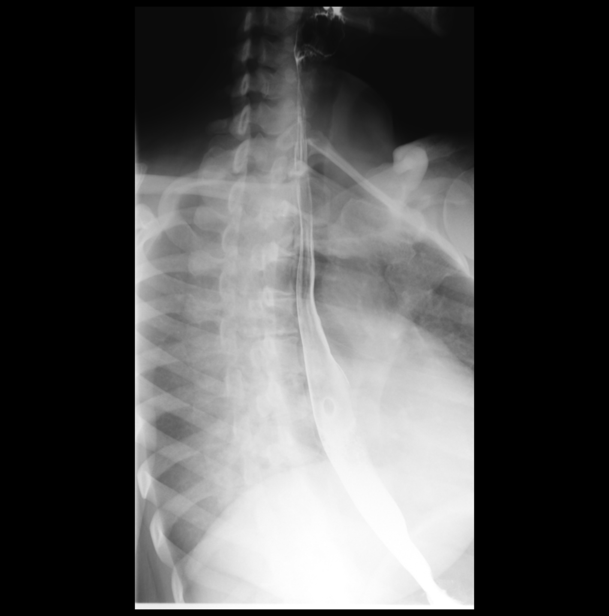
[frame 23/34]
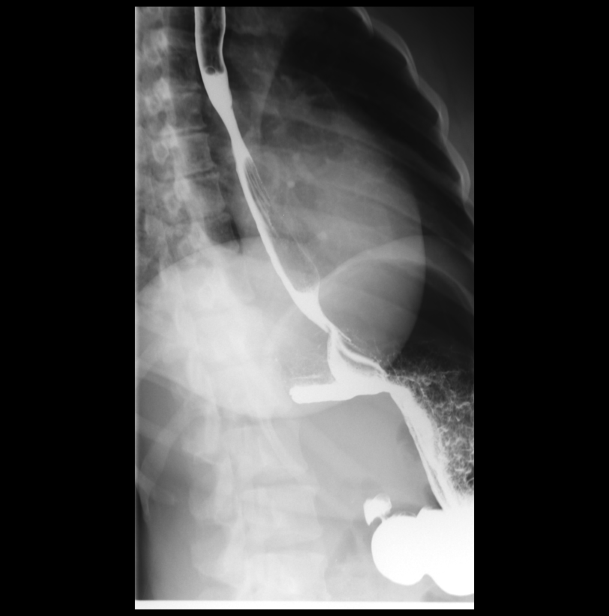
[frame 29/34]
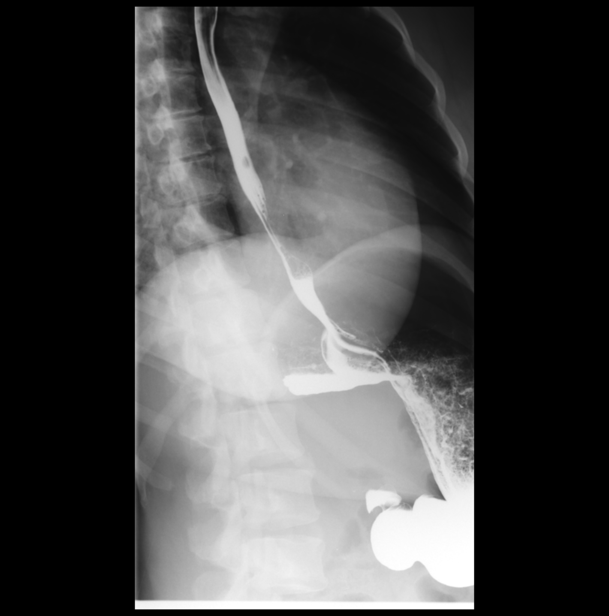

[Series 5: fluoro_barium 2fps_bw · 0.18mm/px · 3 of 7 frames shown (4 of 4)]
[frame 2/7]
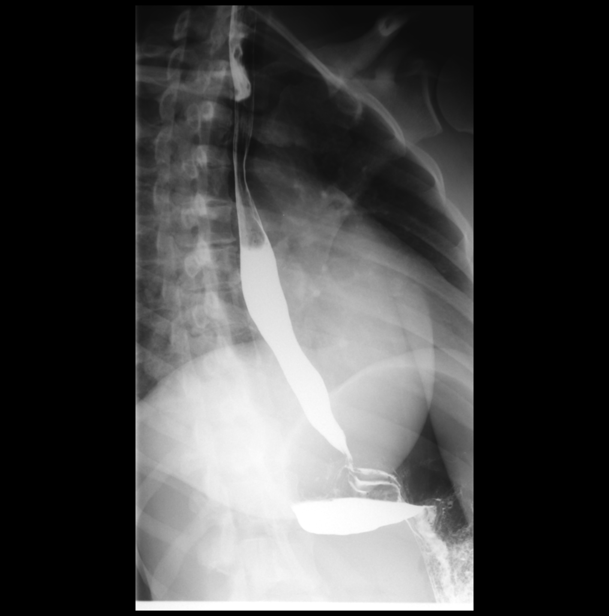
[frame 4/7]
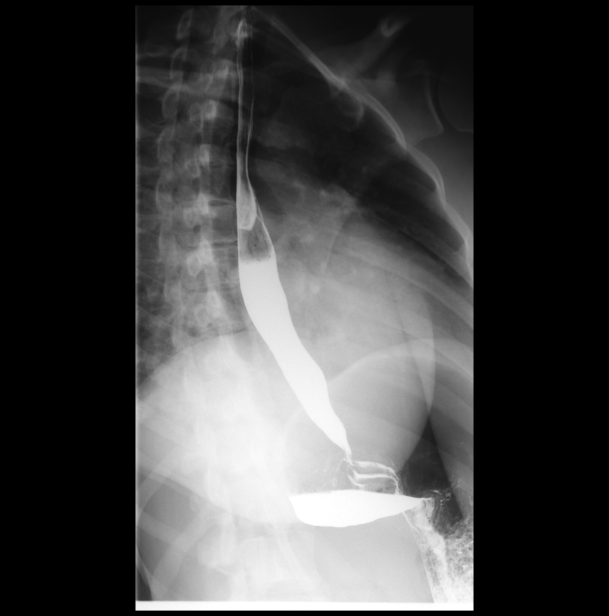
[frame 6/7]
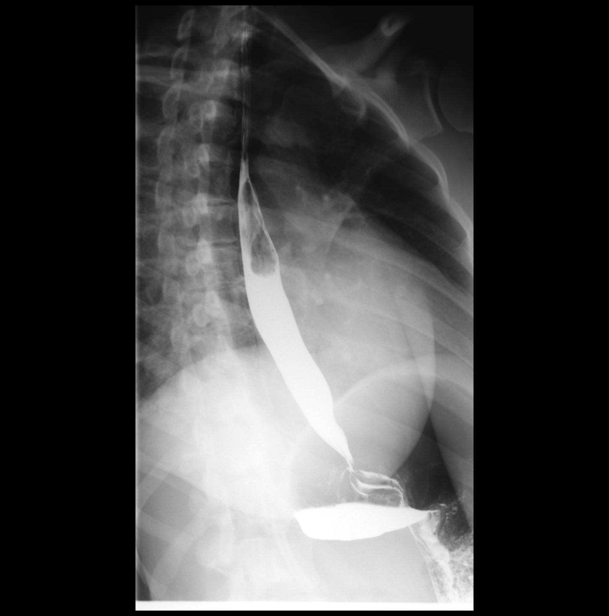

[Series 6: cp_standard · 0.30mm/px · 1 of 1 slices shown (1 of 3)]
[im 1/1]
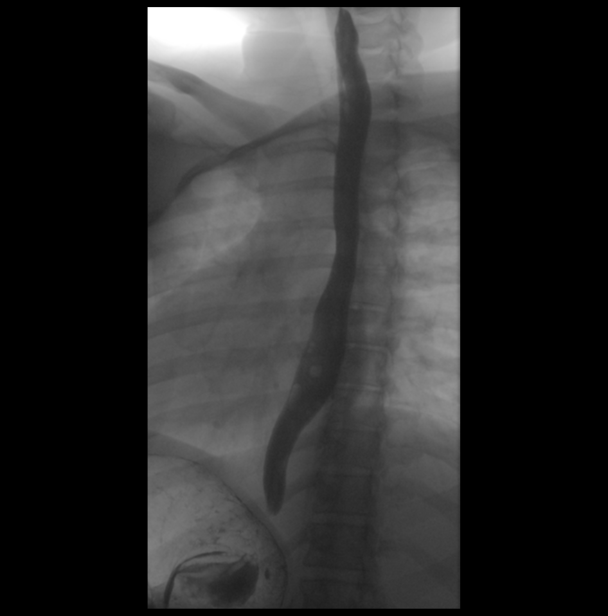

[Series 7: cp_standard · 0.30mm/px · 1 of 1 slices shown (2 of 3)]
[im 1/1]
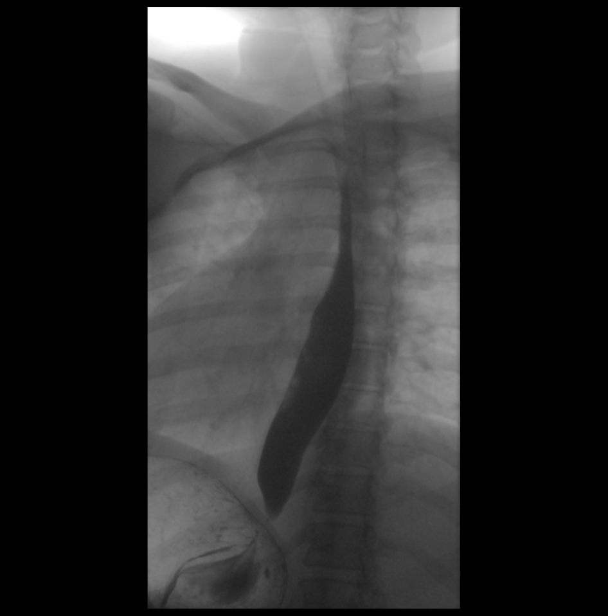

[Series 9: cp_standard · 0.28mm/px · 1 of 1 slices shown (3 of 3)]
[im 1/1]
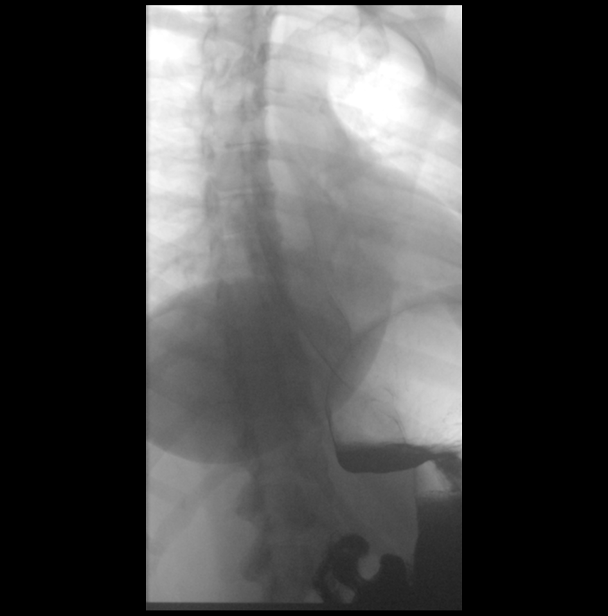

[14 of 20 positions shown; findings below may reference images not displayed]

FINDINGS: Frontal lateral views of the hypopharynx while swallowing thick
barium are normal. Double contrast imaging of the esophagus is
normal, without mass lesion, stricture, or mucosal ulcer. Esophageal
motility is normal. 13 mm barium tablet passes readily into the
stomach when taken with water.
IMPRESSION: Normal exam.

## 2017-02-20 ENCOUNTER — Other Ambulatory Visit: Payer: BC Managed Care – PPO

## 2017-02-20 DIAGNOSIS — E039 Hypothyroidism, unspecified: Secondary | ICD-10-CM

## 2017-02-20 NOTE — Addendum Note (Signed)
Addended by: Winn JockVALENTINE, Sora Olivo N on: 02/20/2017 02:09 PM   Modules accepted: Orders

## 2017-02-21 ENCOUNTER — Other Ambulatory Visit: Payer: Self-pay | Admitting: Medical

## 2017-02-21 ENCOUNTER — Telehealth: Payer: Self-pay

## 2017-02-21 LAB — TSH: TSH: 5.24 u[IU]/mL — ABNORMAL HIGH (ref 0.450–4.500)

## 2017-02-21 LAB — T4, FREE: Free T4: 1.5 ng/dL (ref 0.82–1.77)

## 2017-02-21 MED ORDER — LEVOTHYROXINE SODIUM 125 MCG PO TABS: 125.0000 ug | ORAL_TABLET | Freq: Every day | ORAL | 3 refills | Status: DC

## 2017-03-05 NOTE — Telephone Encounter (Signed)
error 

## 2017-03-15 ENCOUNTER — Encounter: Payer: Self-pay | Admitting: Medical

## 2017-03-15 ENCOUNTER — Ambulatory Visit: Payer: BC Managed Care – PPO | Admitting: Medical

## 2017-03-15 VITALS — BP 124/74 | HR 74 | Ht 64.0 in | Wt 217.6 lb

## 2017-03-15 DIAGNOSIS — R911 Solitary pulmonary nodule: Secondary | ICD-10-CM | POA: Insufficient documentation

## 2017-03-15 DIAGNOSIS — R202 Paresthesia of skin: Secondary | ICD-10-CM | POA: Diagnosis not present

## 2017-03-15 DIAGNOSIS — N393 Stress incontinence (female) (male): Secondary | ICD-10-CM | POA: Diagnosis not present

## 2017-03-15 DIAGNOSIS — Z1239 Encounter for other screening for malignant neoplasm of breast: Secondary | ICD-10-CM

## 2017-03-15 DIAGNOSIS — Z0000 Encounter for general adult medical examination without abnormal findings: Secondary | ICD-10-CM | POA: Diagnosis not present

## 2017-03-15 DIAGNOSIS — E039 Hypothyroidism, unspecified: Secondary | ICD-10-CM

## 2017-03-15 DIAGNOSIS — Z1231 Encounter for screening mammogram for malignant neoplasm of breast: Secondary | ICD-10-CM | POA: Diagnosis not present

## 2017-03-15 DIAGNOSIS — Z113 Encounter for screening for infections with a predominantly sexual mode of transmission: Secondary | ICD-10-CM

## 2017-03-15 DIAGNOSIS — R053 Chronic cough: Secondary | ICD-10-CM

## 2017-03-15 DIAGNOSIS — R05 Cough: Secondary | ICD-10-CM | POA: Diagnosis not present

## 2017-03-15 DIAGNOSIS — E669 Obesity, unspecified: Secondary | ICD-10-CM

## 2017-03-15 LAB — POCT URINALYSIS DIP (PROADVANTAGE DEVICE)
BILIRUBIN UA: NEGATIVE mg/dL
Bilirubin, UA: NEGATIVE
Blood, UA: NEGATIVE
GLUCOSE UA: NEGATIVE mg/dL
Leukocytes, UA: NEGATIVE
Nitrite, UA: NEGATIVE
Protein Ur, POC: NEGATIVE mg/dL
SPECIFIC GRAVITY, URINE: 1.015
UUROB: NEGATIVE
pH, UA: 6 (ref 5.0–8.0)

## 2017-03-15 LAB — POCT WET PREP (WET MOUNT)

## 2017-03-15 MED ORDER — FLUTICASONE-SALMETEROL 250-50 MCG/DOSE IN AEPB: 1.0000 | INHALATION_SPRAY | Freq: Two times a day (BID) | RESPIRATORY_TRACT | 0 refills | Status: AC

## 2017-03-15 NOTE — Assessment & Plan Note (Signed)
There apparently has been some miscommunication.  She says she was not aware that the prior CT of her chest in 2017 showed pulmonary nodules.  I advise she go ahead and make a follow-up with pulmonology.  In the meantime we will begin her on a trial of Advair preventative inhaler to see if that gives her some improvement.  Labs today

## 2017-03-15 NOTE — Assessment & Plan Note (Signed)
History of mild carpal tunnel syndrome, she does not complain much of this today

## 2017-03-15 NOTE — Progress Notes (Signed)
Subjective: Chief Complaint  Patient presents with  . Annual Exam    phyiscal ,discuss  throyid issues, coughing causing her urination, pt is fasting labs    Here for physical  Concerns: Gail Bartlett - mild, still taking Dexilant but it doesn't really help the cough and not sure it helps the GERD.   Cough - still has cough despite GERD medication.  Has had cough for 2- 3 years.  Has been to ENT and pulmonology specialist, had CT scan.   Has had PFT test.  hasn't seen pulm in past year.  Gets some sharp pains in left breast at times, random.  No lumps in breast  Not sexually active in last 3 years.   Has incontinence, worse with cough.   Reviewed their medical, surgical, family, social, medication, and allergy history and updated chart as appropriate.  Past Medical History:  Diagnosis Date  . Allergic rhinitis   . Amenorrhea    since ablation in 2007  . Chronic cough   . GERD (gastroesophageal reflux disease)   . Hepatitis B immune 10/2014   per titer  . Hypothyroidism age 47  . Impaired fasting glucose    when at 200 pounds  . Need for hepatitis C screening test 10/2014   negative screen  . Obesity   . Trichomonas vaginalis infection 04/2011    Past Surgical History:  Procedure Laterality Date  . CESAREAN SECTION    . CHOLECYSTECTOMY  11/2014  . ENDOMETRIAL ABLATION  2006 or 2007  . ESOPHAGOGASTRODUODENOSCOPY  2014   Normal by Dr. Ardis Hughs  . TUBAL LIGATION      Social History   Socioeconomic History  . Marital status: Single    Spouse name: Not on file  . Number of children: 2  . Years of education: Not on file  . Highest education level: Not on file  Social Needs  . Financial resource strain: Not on file  . Food insecurity - worry: Not on file  . Food insecurity - inability: Not on file  . Transportation needs - medical: Not on file  . Transportation needs - non-medical: Not on file  Occupational History  . Occupation: Forensic psychologist: [employer]   Tobacco Use  . Smoking status: Passive Smoke Exposure - Never Smoker  . Smokeless tobacco: Never Used  . Tobacco comment: Mother as a child  Substance and Sexual Activity  . Alcohol use: No  . Drug use: No  . Sexual activity: Yes    Partners: Male    Birth control/protection: Condom  Other Topics Concern  . Not on file  Social History Narrative   Lives with son.  Daughter lives in [place], [place]  (has 1 grandson). No pets.  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  Exercise - walks, goes to the gym sometimes.  Diet- avoids junk food, tries to eat healthy, small portions.        Originally from [place], [place]. She moved to [facility] [place] in 2006. She has previously traveled to [place], [place], [place], & [place]. No pets currently. No bird, mold, or hot tub exposure. 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 Always had a negative PPD skin test. Previously enjoyed playing volleyball.        03/2017    Family History  Problem Relation Age of Onset  . Hypertension Mother   . Anemia Mother        needed blood tranfusions  .  Heart disease Mother 68       stent  . Kidney disease Mother        dialysis  . Brain cancer Father   . Schizophrenia Father        paranoid  . Gout Father   . Diabetes Brother   . ADD / ADHD Daughter   . ADD / ADHD Son   . Lung cancer Maternal Grandmother   . Cancer Maternal Aunt   . Colon cancer Neg Hx   . Breast cancer Neg Hx   . Lung disease Neg Hx   . Rheumatologic disease Neg Hx      Current Outpatient Medications:  .  DEXILANT 60 MG capsule, TAKE ONE CAPSULE BY MOUTH ONCE DAILY, Disp: 90 capsule, Rfl: 1 .  fluticasone (FLONASE) 50 MCG/ACT nasal spray, USE TWO SPRAYS INTO THE NOSE DAILY., Disp: 16 g, Rfl: 11 .  levothyroxine (SYNTHROID) 125 MCG tablet, Take 1 tablet (125 mcg total) by mouth daily., Disp: 30 tablet, Rfl: 3 .  cyclobenzaprine (FLEXERIL) 10 MG tablet, 1 tablet po QHS prn for back spasm, or up to BID, caution sedation (Patient not taking:  Reported on 03/15/2017), Disp: 12 tablet, Rfl: 0  Current Facility-Administered Medications:  .  measles, mumps and rubella vaccine (MMR) injection 0.5 mL, 0.5 mL, Subcutaneous, Once, Denita Lung, MD  No Known Allergies   Review of Systems Constitutional: -fever, -chills, -sweats, -unexpected weight change, -decreased appetite, -fatigue Allergy: -sneezing, -itching, -congestion Dermatology: -changing moles, --rash, -lumps ENT: -runny nose, -ear pain, -sore throat, -hoarseness, -sinus pain, -teeth pain, - ringing in ears, -hearing loss, -nosebleeds Cardiology: -chest pain, -palpitations, -swelling, -difficulty breathing when lying flat, -waking up short of breath Respiratory: +cough, -shortness of breath, -difficulty breathing with exercise or exertion, -wheezing, -coughing up blood Gastroenterology: -abdominal pain, -nausea, -vomiting, -diarrhea, -constipation, -blood in stool, -changes in bowel movement, -difficulty swallowing or eating Hematology: -bleeding, -bruising  Musculoskeletal: -joint aches, -muscle aches, -joint swelling, -back pain, -neck pain, -cramping, -changes in gait Ophthalmology: denies vision changes, eye redness, itching, discharge Urology: -burning with urination, -difficulty urinating, -blood in urine, -urinary frequency, -urgency, +incontinence Neurology: -headache, -weakness, -tingling, -numbness, -memory loss, -falls, -dizziness Psychology: -depressed mood, -agitation, -sleep problems Breast/gyn: -breast tenderness, -discharge, -lumps, -vaginal discharge,- irregular periods, -heavy periods    Objective:   BP 124/74   Pulse 74   Ht '5\' 4"'  (1.626 m)   Wt 217 lb 9.6 oz (98.7 kg)   SpO2 98%   BMI 37.35 kg/m   General appearance: alert, no distress, WD/WN, African American female Skin: unremarkable HEENT: normocephalic, conjunctiva/corneas normal, sclerae anicteric, PERRLA, EOMi, nares patent, no discharge or erythema, pharynx normal Oral cavity: MMM, tongue  normal, teeth normal Neck: supple, no lymphadenopathy, no thyromegaly, no masses, normal ROM, no bruits Chest: non tender, normal shape and expansion Heart: RRR, normal S1, S2, no murmurs Lungs: CTA bilaterally, no wheezes, rhonchi, or rales Abdomen: +bs, soft, +RUQ and upper port surgical scars, non tender, non distended, no masses, no hepatomegaly, no splenomegaly, no bruits Back: non tender, normal ROM, no scoliosis Musculoskeletal: upper extremities non tender, no obvious deformity, normal ROM throughout, lower extremities non tender, no obvious deformity, normal ROM throughout Extremities: no edema, no cyanosis, no clubbing Pulses: 2+ symmetric, upper and lower extremities, normal cap refill Neurological: alert, oriented x 3, CN2-12 intact, strength normal upper extremities and lower extremities, sensation normal throughout, DTRs 2+ throughout, no cerebellar signs, gait normal Psychiatric: normal affect, behavior normal, pleasant  Breast: nontender,  no masses or lumps, no skin changes, no nipple discharge or inversion, no axillary lymphadenopathy Gyn: Normal external genitalia without lesions, vagina with normal mucosa, cervix without lesions, no cervical motion tenderness, no abnormal vaginal discharge.  Uterus possibly enlarged, mild right adnexal tenderness, otherwise nontender, no masses.  Pap performed.  Exam chaperoned by nurse. Rectal: anus normal appearing   Assessment and Plan :    Encounter Diagnoses  Name Primary?  . Encounter for health maintenance examination in adult Yes  . Chronic cough   . Obesity with serious comorbidity, unspecified classification, unspecified obesity type   . Screening for breast cancer   . SUI (stress urinary incontinence, female)   . Paresthesia   . Hypothyroidism, unspecified type   . Pulmonary nodule   . Screen for STD (sexually transmitted disease)    Hypothyroidism Compliant with medications, repeat labs today as we increased her dose  last visit  Chronic cough There apparently has been some miscommunication.  She says she was not aware that the prior CT of her chest in 2017 showed pulmonary nodules.  I advise she go ahead and make a follow-up with pulmonology.  In the meantime we will begin her on a trial of Advair preventative inhaler to see if that gives her some improvement.  Labs today  Paresthesia History of mild carpal tunnel syndrome, she does not complain much of this today  Obesity Counseled on diet, exercise, need to lose weight  Pulmonary nodule Review 2017 CT chest.  Advise follow-up with pulmonology  Encounter for health maintenance examination in adult Discussed and counseled on healthy lifestyle, diet, exercise, preventative care, vaccinations, sick and well care, proper use of emergency dept and after hours care, and addressed their concerns.  They were advised to see their eye doctor and dentist yearly.    Zlaty was seen today for annual exam.  Diagnoses and all orders for this visit:  Encounter for health maintenance examination in adult -     Comprehensive metabolic panel -     CBC with Differential/Platelet -     TSH -     Hemoglobin A1c -     T4, free -     HIV antibody -     RPR -     GC/Chlamydia Probe Amp -     POCT Wet Prep ([place])  Chronic cough  Obesity with serious comorbidity, unspecified classification, unspecified obesity type -     Hemoglobin A1c  Screening for breast cancer  SUI (stress urinary incontinence, female)  Paresthesia  Hypothyroidism, unspecified type -     TSH -     T4, free  Pulmonary nodule  Screen for STD (sexually transmitted disease) -     HIV antibody -     RPR -     GC/Chlamydia Probe Amp -     POCT Wet Prep [employer] [place])    Follow-up pending labs, yearly for physical

## 2017-03-15 NOTE — Assessment & Plan Note (Signed)
Discussed and counseled on healthy lifestyle, diet, exercise, preventative care, vaccinations, sick and well care, proper use of emergency dept and after hours care, and addressed their concerns.  They were advised to see their eye doctor and dentist yearly. 

## 2017-03-15 NOTE — Assessment & Plan Note (Signed)
Counseled on diet, exercise, need to lose weight

## 2017-03-15 NOTE — Assessment & Plan Note (Signed)
Compliant with medications, repeat labs today as we increased her dose last visit

## 2017-03-15 NOTE — Assessment & Plan Note (Signed)
Review 2017 CT chest.  Advise follow-up with pulmonology

## 2017-03-16 LAB — CBC WITH DIFFERENTIAL/PLATELET
BASOS: 0 %
Basophils Absolute: 0 10*3/uL (ref 0.0–0.2)
EOS (ABSOLUTE): 0.2 10*3/uL (ref 0.0–0.4)
Eos: 2 %
Hematocrit: 37.9 % (ref 34.0–46.6)
Hemoglobin: 12.6 g/dL (ref 11.1–15.9)
IMMATURE GRANS (ABS): 0 10*3/uL (ref 0.0–0.1)
IMMATURE GRANULOCYTES: 0 %
LYMPHS: 28 %
Lymphocytes Absolute: 2.5 10*3/uL (ref 0.7–3.1)
MCH: 26.9 pg (ref 26.6–33.0)
MCHC: 33.2 g/dL (ref 31.5–35.7)
MCV: 81 fL (ref 79–97)
Monocytes Absolute: 0.5 10*3/uL (ref 0.1–0.9)
Monocytes: 6 %
NEUTROS PCT: 64 %
Neutrophils Absolute: 5.6 10*3/uL (ref 1.4–7.0)
PLATELETS: 296 10*3/uL (ref 150–379)
RBC: 4.69 x10E6/uL (ref 3.77–5.28)
RDW: 14.7 % (ref 12.3–15.4)
WBC: 8.8 10*3/uL (ref 3.4–10.8)

## 2017-03-16 LAB — COMPREHENSIVE METABOLIC PANEL
A/G RATIO: 1.6 (ref 1.2–2.2)
ALT: 17 IU/L (ref 0–32)
AST: 15 IU/L (ref 0–40)
Albumin: 4.3 g/dL (ref 3.5–5.5)
Alkaline Phosphatase: 106 IU/L (ref 39–117)
BUN/Creatinine Ratio: 20 (ref 9–23)
BUN: 21 mg/dL (ref 6–24)
Bilirubin Total: <0.2 mg/dL (ref 0.0–1.2)
CALCIUM: 9.3 mg/dL (ref 8.7–10.2)
CO2: 19 mmol/L — AB (ref 20–29)
CREATININE: 1.06 mg/dL — AB (ref 0.57–1.00)
Chloride: 105 mmol/L (ref 96–106)
GFR calc Af Amer: 73 mL/min/{1.73_m2} (ref >59)
GFR, EST NON AFRICAN AMERICAN: 63 mL/min/{1.73_m2} (ref >59)
Globulin, Total: 2.7 g/dL (ref 1.5–4.5)
Glucose: 80 mg/dL (ref 65–99)
POTASSIUM: 4.2 mmol/L (ref 3.5–5.2)
Sodium: 141 mmol/L (ref 134–144)
Total Protein: 7 g/dL (ref 6.0–8.5)

## 2017-03-16 LAB — GC/CHLAMYDIA PROBE AMP
CHLAMYDIA, DNA PROBE: NEGATIVE
NEISSERIA GONORRHOEAE BY PCR: NEGATIVE

## 2017-03-16 LAB — T4, FREE: FREE T4: 1.74 ng/dL (ref 0.82–1.77)

## 2017-03-16 LAB — HEMOGLOBIN A1C
ESTIMATED AVERAGE GLUCOSE: 126 mg/dL
HEMOGLOBIN A1C: 6 % — AB (ref 4.8–5.6)

## 2017-03-16 LAB — TSH: TSH: 2.53 u[IU]/mL (ref 0.450–4.500)

## 2017-03-16 LAB — RPR: RPR Ser Ql: NONREACTIVE

## 2017-03-16 LAB — HIV ANTIBODY (ROUTINE TESTING W REFLEX): HIV Screen 4th Generation wRfx: NONREACTIVE

## 2017-03-18 ENCOUNTER — Other Ambulatory Visit: Payer: Self-pay | Admitting: Medical

## 2017-03-18 ENCOUNTER — Telehealth: Payer: Self-pay | Admitting: Medical

## 2017-03-18 DIAGNOSIS — R911 Solitary pulmonary nodule: Secondary | ICD-10-CM

## 2017-03-18 NOTE — Telephone Encounter (Signed)
Pt called for lab results.  Would like to be called with results.  Also she would like referral for repeat CT scan regarding nodules, [place], [facility] t# [phone],  does not [facility] because they never called her last time with results when there was something wrong.

## 2017-03-18 NOTE — Telephone Encounter (Signed)
Lets refer her to [place] [facility] [place] as requested.  I'll update an order for chest CT

## 2017-03-19 NOTE — Telephone Encounter (Signed)
Sent referral to [facility]  P[facility] clinic fax # [phone]

## 2017-03-24 NOTE — Telephone Encounter (Signed)
Referred to [facility]

## 2017-03-25 NOTE — Telephone Encounter (Signed)
This referral has already been sent

## 2017-04-05 ENCOUNTER — Telehealth: Payer: Self-pay

## 2017-04-05 NOTE — Telephone Encounter (Signed)
Called and l/m for pt to let her know that I  refaxed health form to her insurance and mail out a copy of form to her as well.

## 2017-04-23 ENCOUNTER — Telehealth: Payer: Self-pay | Admitting: Medical

## 2017-04-23 NOTE — Telephone Encounter (Signed)
Pt called and states that the form that was filled out was not correct in the blank spot it needs to have that she is pre diabetic and states that is in her chart, so she can go to the gym and her health insurance pay for it, and we fax this to [phone] and call her at [phone] when this done, put in your folder, the last one was not good enough for the insurance company,

## 2017-04-24 NOTE — Telephone Encounter (Signed)
Faxed back and emailed copy back to patient

## 2017-04-24 NOTE — Telephone Encounter (Signed)
Copy and send back form

## 2017-06-18 ENCOUNTER — Other Ambulatory Visit: Payer: Self-pay | Admitting: Medical

## 2017-06-18 ENCOUNTER — Telehealth: Payer: Self-pay | Admitting: Medical

## 2017-06-18 MED ORDER — LEVOTHYROXINE SODIUM 125 MCG PO TABS: 125.0000 ug | ORAL_TABLET | Freq: Every day | ORAL | 0 refills | Status: AC

## 2017-06-18 NOTE — Telephone Encounter (Signed)
Please call patient about thyroid meds, you changed them in February, she needs rx and needs to know if she needs to be seen or not  `  Please advise patient either way  She uses [facility]

## 2017-06-18 NOTE — Telephone Encounter (Signed)
Last labs looked good ,so she can c/t current dose.  Is she not using [facility] in [place] for the thyroid medication?

## 2017-06-20 NOTE — Telephone Encounter (Signed)
Pt called back very upset that she has not heard anything about her thyroid meds yet or if she needs an appointment and med not at her job pharmacy at [employer] as she requested. Advised pt that refill sent to [facility]. She is very upset because she asked that the med not be sent to [facility] at all because they will not take it back and meds are less expensive and more convenient at her job.

## 2017-06-21 ENCOUNTER — Other Ambulatory Visit: Payer: Self-pay | Admitting: Medical

## 2017-06-21 NOTE — Telephone Encounter (Signed)
Pt medication has been sent to the correct pharmacy.

## 2017-06-21 NOTE — Telephone Encounter (Signed)
Pt called and canceled the medication with previous pharmacy. Medication has been sent to [facility] pharmacy per patient

## 2017-10-10 ENCOUNTER — Other Ambulatory Visit: Payer: Self-pay | Admitting: Obstetrics and Gynecology

## 2017-10-10 DIAGNOSIS — N6320 Unspecified lump in the left breast, unspecified quadrant: Principal | ICD-10-CM

## 2017-10-10 DIAGNOSIS — N6310 Unspecified lump in the right breast, unspecified quadrant: Secondary | ICD-10-CM

## 2017-10-14 ENCOUNTER — Other Ambulatory Visit: Payer: Self-pay | Admitting: Obstetrics and Gynecology

## 2017-10-14 DIAGNOSIS — N6310 Unspecified lump in the right breast, unspecified quadrant: Secondary | ICD-10-CM

## 2017-10-14 DIAGNOSIS — N6320 Unspecified lump in the left breast, unspecified quadrant: Principal | ICD-10-CM

## 2017-10-23 ENCOUNTER — Ambulatory Visit
Admission: RE | Admit: 2017-10-23 | Discharge: 2017-10-23 | Disposition: A | Discharge status: Home / Self Care | Payer: BC Managed Care – PPO | Source: Ambulatory Visit | Attending: Obstetrics and Gynecology | Admitting: Obstetrics and Gynecology

## 2017-10-23 DIAGNOSIS — N6310 Unspecified lump in the right breast, unspecified quadrant: Secondary | ICD-10-CM | POA: Insufficient documentation

## 2017-10-23 DIAGNOSIS — N6320 Unspecified lump in the left breast, unspecified quadrant: Secondary | ICD-10-CM | POA: Diagnosis present

## 2018-03-05 ENCOUNTER — Encounter: Payer: Self-pay | Admitting: Urology

## 2018-03-05 ENCOUNTER — Ambulatory Visit: Payer: Self-pay | Admitting: Urology

## 2018-03-05 ENCOUNTER — Ambulatory Visit: Payer: BC Managed Care – PPO | Admitting: Urology

## 2018-03-05 VITALS — BP 114/75 | HR 103 | Ht 64.0 in | Wt 197.1 lb

## 2018-03-05 DIAGNOSIS — N3941 Urge incontinence: Secondary | ICD-10-CM

## 2018-03-05 DIAGNOSIS — N3946 Mixed incontinence: Secondary | ICD-10-CM

## 2018-03-05 NOTE — Progress Notes (Signed)
03/05/2018 2:23 PM   Gail Bartlett Dec 19, 1970 [id number]  Referring provider: Suzy Bouchard, MD [address] [facility] [place], [place] [address]  Chief Complaint  Patient presents with  . Over Active Bladder    HPI:  48 yo female seen today for incontinence. Going on for several years. Worse over past few three years. She feels like she has a slow stream. She strains and positional voids for "6-7 minutes". No frequency. She has urgency and UUI. She gets "tingling sensation" and has trouble getting there. She will leak. She leaks when she coughs and sneezes. She wears pads every day - heavy ones.  No hysterectomy. No prolapse. No NG risk. No constipation. She saw pulm and was told she had allergy and asthma.   She tried Myrbetriq 50 mg. 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 No change. She tried PT and doesn't want to do it again. 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   Modifying factors: There are no other modifying factors  Associated signs and symptoms: There are no other associated signs and symptoms Aggravating and relieving factors: There are no other aggravating or relieving factors Severity: Moderate Duration: Persistent   PMH: Past Medical History:  Diagnosis Date  . Allergic rhinitis   . Amenorrhea    since ablation in 2007  . Chronic cough   . GERD (gastroesophageal reflux disease)   . Hepatitis B immune 10/2014   per titer  . Hypothyroidism age 41  . Impaired fasting glucose    when at 200 pounds  . Need for hepatitis C screening test 10/2014   negative screen  . Obesity   . Trichomonas vaginalis infection 04/2011    Surgical History: Past Surgical History:  Procedure Laterality Date  . BREAST BIOPSY Left 03/21/2016   FIBROADENOMA, FIBROCYSTIC CHANGES WITH  . CESAREAN SECTION    . CHOLECYSTECTOMY  11/2014  . ENDOMETRIAL ABLATION  2006 or 2007  . ESOPHAGOGASTRODUODENOSCOPY  2014   Normal by Dr. Christella Hartigan  . TUBAL LIGATION      Home  Medications:  Allergies as of 03/05/2018   No Known Allergies     Medication List       Accurate as of March 05, 2018  2:23 PM. Always use your most recent med list.        fluticasone 50 MCG/ACT nasal spray Commonly known as:  FLONASE USE TWO SPRAYS INTO THE NOSE DAILY.   Fluticasone-Salmeterol 250-50 MCG/DOSE Aepb Commonly known as:  ADVAIR DISKUS Inhale 1 puff into the lungs 2 (two) times daily.   levothyroxine 125 MCG tablet Commonly known as:  SYNTHROID Take 1 tablet (125 mcg total) by mouth daily.   SYNTHROID 125 MCG tablet Generic drug:  levothyroxine TAKE 1 TABLET BY MOUTH ONCE DAILY   liothyronine 5 MCG tablet Commonly known as:  CYTOMEL Take by mouth.   montelukast 10 MG tablet Commonly known as:  SINGULAIR Take by mouth.   pantoprazole 40 MG tablet Commonly known as:  PROTONIX Take by mouth.       Allergies: No Known Allergies  Family History: Family History  Problem Relation Age of Onset  . Hypertension Mother   . Anemia Mother        needed blood tranfusions  . Heart disease Mother 56       stent  . Kidney disease Mother        dialysis  . Brain cancer Father   . Schizophrenia Father  paranoid  . Gout Father   . Diabetes Brother   . ADD / ADHD Daughter   . ADD / ADHD Son   . Lung cancer Maternal Grandmother   . Cancer Maternal Aunt   . Colon cancer Neg Hx   . Breast cancer Neg Hx   . Lung disease Neg Hx   . Rheumatologic disease Neg Hx     Social History:  reports that she is a non-smoker but has been exposed to tobacco smoke. She has never used smokeless tobacco. She reports that she does not drink alcohol or use drugs.  ROS: UROLOGY Frequent Urination?: No Hard to postpone urination?: No Burning/pain with urination?: No Get up at night to urinate?: Yes Leakage of urine?: Yes Urine stream starts and stops?: No Trouble starting stream?: No Do you have to strain to urinate?: No Blood in urine?: No Urinary tract  infection?: No Sexually transmitted disease?: No Injury to kidneys or bladder?: No Painful intercourse?: No Weak stream?: No Currently pregnant?: No Vaginal bleeding?: No Last menstrual period?: n  Gastrointestinal Nausea?: Yes Vomiting?: Yes Indigestion/heartburn?: Yes Diarrhea?: No Constipation?: No  Constitutional Fever: No Night sweats?: Yes Weight loss?: No Fatigue?: No  Skin Skin rash/lesions?: No Itching?: No  Eyes Blurred vision?: No Double vision?: No  Ears/Nose/Throat Sore throat?: No Sinus problems?: No  Hematologic/Lymphatic Swollen glands?: No Easy bruising?: No  Cardiovascular Leg swelling?: No Chest pain?: No  Respiratory Cough?: Yes Shortness of breath?: No  Endocrine Excessive thirst?: No  Musculoskeletal Back pain?: No Joint pain?: No  Neurological Headaches?: No Dizziness?: No  Psychologic Depression?: No Anxiety?: No  Physical Exam: BP 114/75 (BP Location: Left Arm, Patient Position: Sitting, Cuff Size: Normal)   Pulse (!) 103   Ht 5\' 4"  (1.626 m)   Wt 89.4 kg   BMI 33.83 kg/m   Constitutional:  Alert and oriented, No acute distress. HEENT: [place] AT, moist mucus membranes.  Trachea midline, no masses. Cardiovascular: No clubbing, cyanosis, or edema. Respiratory: Normal respiratory effort, no increased work of breathing. GI: Abdomen is soft, nontender, nondistended, no abdominal masses GU: No CVA tenderness Skin: No rashes, bruises or suspicious lesions. Neurologic: Grossly intact, no focal deficits, moving all 4 extremities. Psychiatric: Normal mood and affect.  Laboratory Data: Lab Results  Component Value Date   WBC 8.8 03/15/2017   HGB 12.6 03/15/2017   HCT 37.9 03/15/2017   MCV 81 03/15/2017   PLT 296 03/15/2017    Lab Results  Component Value Date   CREATININE 1.06 (H) 03/15/2017    No results found for: PSA  No results found for: TESTOSTERONE  Lab Results  Component Value Date   HGBA1C 6.0 (H)  03/15/2017    Urinalysis    Component Value Date/Time   LABSPEC 1.015 03/15/2017 1305   BILIRUBINUR negative 03/15/2017 1305   BILIRUBINUR neg 05/16/2016 1356   KETONESUR negative 03/15/2017 1305   PROTEINUR negative 03/15/2017 1305   PROTEINUR neg 05/16/2016 1356   UROBILINOGEN negative (A) 05/16/2016 1356   NITRITE Negative 03/15/2017 1305   NITRITE neg 05/16/2016 1356   LEUKOCYTESUR Negative 03/15/2017 1305    No results found for: LABMICR, WBCUA, RBCUA, LABEPIT, MUCUS, BACTERIA   No results found for this or any previous visit. No results found for this or any previous visit. No results found for this or any previous visit. No results found for this or any previous visit. No results found for this or any previous visit. No results found for this or any  previous visit. No results found for this or any previous visit. No results found for this or any previous visit.  Assessment & Plan:    Mixed incontinence, urgency - discussed PT. She may get a hysterectomy. We discussed sometimes urinary symptoms get better or worse after surgery. I gave her samples of Myrbetriq and Toviaz. See back in 3 months. She did see GI doc and might see ENT for cough eval. .   There are no diagnoses linked to this encounter.  No follow-ups on file.  Jerilee FieldMatthew Venora Kautzman, MD  [facility] [address], [address] [place], [place] [id number] [phone]

## 2018-03-05 NOTE — Patient Instructions (Signed)

## 2018-03-10 ENCOUNTER — Ambulatory Visit: Payer: Self-pay | Admitting: Urology

## 2018-03-17 ENCOUNTER — Ambulatory Visit: Payer: Self-pay | Admitting: Urology

## 2018-06-04 ENCOUNTER — Ambulatory Visit: Payer: BC Managed Care – PPO | Admitting: Urology

## 2018-07-16 ENCOUNTER — Other Ambulatory Visit: Payer: Self-pay

## 2018-07-16 ENCOUNTER — Telehealth (INDEPENDENT_AMBULATORY_CARE_PROVIDER_SITE_OTHER): Payer: BC Managed Care – PPO | Admitting: Urology

## 2018-07-16 DIAGNOSIS — N3946 Mixed incontinence: Secondary | ICD-10-CM

## 2018-07-16 NOTE — Patient Instructions (Signed)

## 2018-07-16 NOTE — Progress Notes (Signed)
Virtual Visit via Telephone Note  I connected with Gail Bartlett on 07/16/18 at 10:00 AM EDT by telephone and verified that I am speaking with the correct person using two identifiers.  Location: Patient: Gail Bartlett Provider: Dr. Junious Silk    I discussed the limitations, risks, security and privacy concerns of performing an evaluation and management service by telephone and the availability of in person appointments. I also discussed with the patient that there may be a patient responsible charge related to this service. The patient expressed understanding and agreed to proceed.   History of Present Illness:  47 yo female follows up for incontinence. Going on for several years. Worse since 2017. She feels like she has a slow stream. She strains and positional voids for "6-7 minutes". No frequency. She has urgency and UUI. She gets "tingling sensation" and has trouble getting there. She will leak. She leaks when she coughs and sneezes. She wears pads every day - heavy ones.  No hysterectomy. No prolapse. No NG risk. No constipation. She saw pulm and was told she had allergy and asthma.   She tried Myrbetriq 50 mg. 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 No change. She tried PT and doesn't want to do it again. 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 We discussed sometimes urinary symptoms get better or worse after surgery. She is using more pads. No prolapse symptoms.  I gave her samples of Myrbetriq and Toviaz. She noted no difference. She leaks when she coughs. She has a chronic cough. She is working with ENT and GI. She has reflux.  No gross hematuria or dysuria. She can hold for a "very long time" which is just 30-40 minutes. She has turn key and tingling  in her hands.  ROS  - 12 system ROS negative  +cough   Observations/Objective:   Assessment and Plan:   Follow Up Instructions:    I discussed the assessment and treatment plan with the patient. The patient was provided an opportunity to ask questions and all were answered. The patient agreed with the plan and demonstrated an understanding of the instructions.   The patient was advised to call back or seek an in-person evaluation if the symptoms worsen or if the condition fails to improve as anticipated.  I provided 9 minutes of non-face-to-face time during this encounter.   Festus Aloe, MD
